# Patient Record
Sex: Female | Born: 1945 | Race: Black or African American | Hispanic: No | Smoking: Former smoker
Health system: Southern US, Community
[De-identification: ages and names within clinical notes are randomized; demographics above are authoritative.]

## PROBLEM LIST (undated history)

## (undated) DIAGNOSIS — Z950 Presence of cardiac pacemaker: Secondary | ICD-10-CM

## (undated) DIAGNOSIS — I10 Essential (primary) hypertension: Secondary | ICD-10-CM

## (undated) DIAGNOSIS — I509 Heart failure, unspecified: Secondary | ICD-10-CM

---

## 2001-09-22 ENCOUNTER — Encounter: Payer: Self-pay | Admitting: Emergency Medicine

## 2001-09-22 ENCOUNTER — Emergency Department (HOSPITAL_COMMUNITY): Admission: EM | Admit: 2001-09-22 | Discharge: 2001-09-22 | Payer: Self-pay | Admitting: Emergency Medicine

## 2001-12-21 ENCOUNTER — Encounter: Admission: RE | Admit: 2001-12-21 | Discharge: 2001-12-21 | Payer: Self-pay | Admitting: Occupational Medicine

## 2001-12-21 ENCOUNTER — Encounter: Payer: Self-pay | Admitting: Occupational Medicine

## 2002-01-30 ENCOUNTER — Encounter: Admission: RE | Admit: 2002-01-30 | Discharge: 2002-02-27 | Payer: Self-pay | Admitting: Occupational Medicine

## 2002-11-26 ENCOUNTER — Ambulatory Visit (HOSPITAL_BASED_OUTPATIENT_CLINIC_OR_DEPARTMENT_OTHER): Admission: RE | Admit: 2002-11-26 | Discharge: 2002-11-26 | Payer: Self-pay | Admitting: Orthopaedic Surgery

## 2018-05-26 DIAGNOSIS — Z95810 Presence of automatic (implantable) cardiac defibrillator: Secondary | ICD-10-CM | POA: Insufficient documentation

## 2021-04-06 ENCOUNTER — Emergency Department (HOSPITAL_COMMUNITY)
Admission: EM | Admit: 2021-04-06 | Discharge: 2021-04-07 | Disposition: A | Discharge status: Home / Self Care | Payer: Medicare HMO | Attending: Emergency Medicine | Admitting: Emergency Medicine

## 2021-04-06 ENCOUNTER — Emergency Department (HOSPITAL_COMMUNITY): Payer: Medicare HMO

## 2021-04-06 ENCOUNTER — Other Ambulatory Visit: Payer: Self-pay

## 2021-04-06 DIAGNOSIS — M25552 Pain in left hip: Secondary | ICD-10-CM | POA: Diagnosis not present

## 2021-04-06 DIAGNOSIS — M79662 Pain in left lower leg: Secondary | ICD-10-CM | POA: Diagnosis not present

## 2021-04-06 DIAGNOSIS — M7989 Other specified soft tissue disorders: Secondary | ICD-10-CM

## 2021-04-06 LAB — CBC
HCT: 40.6 % (ref 36.0–46.0)
Hemoglobin: 12.4 g/dL (ref 12.0–15.0)
MCH: 27.3 pg (ref 26.0–34.0)
MCHC: 30.5 g/dL (ref 30.0–36.0)
MCV: 89.2 fL (ref 80.0–100.0)
Platelets: 297 10*3/uL (ref 150–400)
RBC: 4.55 MIL/uL (ref 3.87–5.11)
RDW: 15.4 % (ref 11.5–15.5)
WBC: 14.1 10*3/uL — ABNORMAL HIGH (ref 4.0–10.5)
nRBC: 0 % (ref 0.0–0.2)

## 2021-04-06 LAB — BASIC METABOLIC PANEL
Anion gap: 10 (ref 5–15)
BUN: 8 mg/dL (ref 8–23)
CO2: 28 mmol/L (ref 22–32)
Calcium: 9.9 mg/dL (ref 8.9–10.3)
Chloride: 100 mmol/L (ref 98–111)
Creatinine, Ser: 0.87 mg/dL (ref 0.44–1.00)
GFR, Estimated: >60 mL/min (ref >60)
Glucose, Bld: 92 mg/dL (ref 70–99)
Potassium: 4.3 mmol/L (ref 3.5–5.1)
Sodium: 138 mmol/L (ref 135–145)

## 2021-04-06 NOTE — ED Provider Notes (Signed)
Emergency Medicine Provider Triage Evaluation Note  Taylor Jordan , a 75 y.o. female  was evaluated in triage.  Pt complains of gradual onset, constant, diffuse LLE swelling for the past 2 days. Pt also complains of pain in her thigh and calf. No hx DVT. She had a day surgery about 1 month ago for nerve stimulator. She is not on a blood thinner. She denies chest pain or SOB.  Review of Systems  Positive: + LLE swelling and pain Negative: - chest pain, SOB  Physical Exam  BP 114/67 (BP Location: Left Arm)   Pulse 79   Temp 98.7 F (37.1 C) (Oral)   Resp 15   Ht 5\' 1"  (1.549 m)   Wt 93.9 kg   SpO2 99%   BMI 39.11 kg/m  Gen:   Awake, no distress   Resp:  Normal effort  MSK:   Moves extremities without difficulty  Other:  Diffuse LLE swelling with TTP to the calf. 2+ DP pulse.   Medical Decision Making  Medically screening exam initiated at 7:21 PM.  Appropriate orders placed.  [name] was informed that the remainder of the evaluation will be completed by another provider, this initial triage assessment does not replace that evaluation, and the importance of remaining in the ED until their evaluation is complete.     Cherie Ouch, PA-C 04/06/21 04/08/21, MD 04/06/21 2252

## 2021-04-06 NOTE — ED Triage Notes (Signed)
Patient reports L leg swelling x 2 days, denies any injury, reports she took her lasix as scheduled without relief, concerned for blood clot.

## 2021-04-07 ENCOUNTER — Emergency Department (HOSPITAL_BASED_OUTPATIENT_CLINIC_OR_DEPARTMENT_OTHER)
Admission: RE | Admit: 2021-04-07 | Discharge: 2021-04-07 | Disposition: A | Discharge status: Home / Self Care | Payer: Medicare HMO | Source: Ambulatory Visit | Attending: Emergency Medicine | Admitting: Emergency Medicine

## 2021-04-07 DIAGNOSIS — M7989 Other specified soft tissue disorders: Secondary | ICD-10-CM | POA: Diagnosis not present

## 2021-04-07 MED ORDER — ENOXAPARIN SODIUM 100 MG/ML IJ SOSY
1.5000 mg/kg | PREFILLED_SYRINGE | Freq: Once | INTRAMUSCULAR | Status: AC
  Administered 2021-04-07: 100 mg via SUBCUTANEOUS
  Filled 2021-04-07: qty 1

## 2021-04-07 NOTE — Progress Notes (Signed)
Lower extremity venous has been completed.   Preliminary results in CV Proc.   Blanch Media 04/07/2021 8:19 AM

## 2021-04-07 NOTE — ED Provider Notes (Signed)
[facility] EMERGENCY DEPARTMENT Provider Note  CSN: [id number] Arrival date & time: 04/06/21 1646  Chief Complaint(s) Leg Pain  HPI Taylor Jordan is a 75 y.o. female here for 1 week of left leg swelling who developed pain in the left calf of the past couple days has been gradually getting worse since onset. Worse with palpation and ambulation. No falls or trauma. She reports having a spinal stimulator placed a month ago. No other surgeries. No prior history of DVT/PE. She denies any chest pain or shortness of breath She is endorsing left hip pain which was diagnosed as bursitis.  This intermittently radiates to the lateral thigh.   HPI  Past Medical History No past medical history on file. There are no problems to display for this patient.  Home Medication(s) Prior to Admission medications   Not on File                                                                                                                                    Past Surgical History ** The histories are not reviewed yet. Please review them in the "History" navigator section and refresh this SmartLink. Family History No family history on file.  Social History   Allergies Patient has no known allergies.  Review of Systems Review of Systems All other systems are reviewed and are negative for acute change except as noted in the HPI  Physical Exam Vital Signs  I have reviewed the triage vital signs BP 138/75 (BP Location: Left Arm)   Pulse 79   Temp 98.7 F (37.1 C) (Oral)   Resp 18   Ht 5\' 1"  (1.549 m)   Wt 93.9 kg   SpO2 100%   BMI 39.11 kg/m   Physical Exam Vitals reviewed.  Constitutional:      General: She is not in acute distress.    Appearance: She is well-developed. She is not diaphoretic.  HENT:     Head: Normocephalic and atraumatic.     Right Ear: External ear normal.     Left Ear: External ear normal.     Nose: Nose normal.  Eyes:     General: No  scleral icterus.    Conjunctiva/sclera: Conjunctivae normal.  Neck:     Trachea: Phonation normal.  Cardiovascular:     Rate and Rhythm: Normal rate and regular rhythm.  Pulmonary:     Effort: Pulmonary effort is normal. No respiratory distress.     Breath sounds: No stridor.  Abdominal:     General: There is no distension.  Musculoskeletal:        General: Normal range of motion.     Cervical back: Normal range of motion.     Right lower leg: Edema present.     Left lower leg: Tenderness (to posterior) present. Edema (greater than contralateral) present.  Neurological:     Mental Status: She is alert and oriented  to person, place, and time.  Psychiatric:        Behavior: Behavior normal.     ED Results and Treatments Labs (all labs ordered are listed, but only abnormal results are displayed) Labs Reviewed  CBC - Abnormal; Notable for the following components:      Result Value   WBC 14.1 (*)    All other components within normal limits  BASIC METABOLIC PANEL                                                                                                                         EKG  EKG Interpretation  Date/Time:    Ventricular Rate:    PR Interval:    QRS Duration:   QT Interval:    QTC Calculation:   R Axis:     Text Interpretation:        Radiology DG Chest 2 View  Result Date: 04/06/2021 CLINICAL DATA:  Left leg swelling EXAM: CHEST - 2 VIEW COMPARISON:  None. FINDINGS: Left-sided pacing device with leads over right atrium and right ventricle. No focal opacity or sizable effusion. Normal cardiomediastinal silhouette with aortic atherosclerosis. No pneumothorax. Ascending thoracic stimulator leads. Incompletely visualized spinal hardware IMPRESSION: No active cardiopulmonary disease. Electronically Signed   By: Jasmine Pang M.D.   On: 04/06/2021 20:09    Pertinent labs & imaging results that were available during my care of the patient were reviewed by me and  considered in my medical decision making (see chart for details).  Medications Ordered in ED Medications  enoxaparin (LOVENOX) injection 100 mg (has no administration in time range)                                                                                                                                    Procedures Procedures  (including critical care time)  Medical Decision Making / ED Course I have reviewed the nursing notes for this encounter and the patient's prior records (if available in EHR or on provided paperwork).   Taylor Jordan was evaluated in Emergency Department on 04/07/2021 for the symptoms described in the history of present illness. She was evaluated in the context of the global COVID-19 pandemic, which necessitated consideration that the patient might be at risk for infection with the SARS-CoV-2 virus that causes COVID-19. Institutional protocols and algorithms that pertain to the evaluation of patients at risk for  COVID-19 are in a state of rapid change based on information released by regulatory bodies including the CDC and federal and state organizations. These policies and algorithms were followed during the patient's care in the ED.  Left lower extremity pain and swelling. No falls or trauma. Will assess for DVT. At this time a night ultrasound not available. Patient's platelets and kidney function intact.  We will treat with a dose of Lovenox and have patient return in the morning for DVT ultrasound.      Final Clinical Impression(s) / ED Diagnoses Final diagnoses:  Pain and swelling of left lower leg   The patient appears reasonably screened and/or stabilized for discharge and I doubt any other medical condition or other [facility] requiring further screening, evaluation, or treatment in the ED at this time prior to discharge. Safe for discharge with strict return precautions.  Disposition: Discharge  Condition: Good  I have discussed the results, Dx  and Tx plan with the patient/family who expressed understanding and agree(s) with the plan. Discharge instructions discussed at length. The patient/family was given strict return precautions who verbalized understanding of the instructions. No further questions at time of discharge.    ED Discharge Orders         Ordered    LE VENOUS        04/07/21 0209             This chart was dictated using voice recognition software.  Despite best efforts to proofread,  errors can occur which can change the documentation meaning.   Nira Conn, MD 04/07/21 [phone]

## 2021-12-01 ENCOUNTER — Other Ambulatory Visit: Payer: Self-pay

## 2021-12-01 ENCOUNTER — Encounter (HOSPITAL_BASED_OUTPATIENT_CLINIC_OR_DEPARTMENT_OTHER): Payer: Self-pay | Admitting: Emergency Medicine

## 2021-12-01 ENCOUNTER — Emergency Department (HOSPITAL_BASED_OUTPATIENT_CLINIC_OR_DEPARTMENT_OTHER)
Admission: EM | Admit: 2021-12-01 | Discharge: 2021-12-01 | Disposition: A | Discharge status: Home / Self Care | Payer: Medicare HMO | Attending: Emergency Medicine | Admitting: Emergency Medicine

## 2021-12-01 ENCOUNTER — Emergency Department (HOSPITAL_BASED_OUTPATIENT_CLINIC_OR_DEPARTMENT_OTHER): Payer: Medicare HMO

## 2021-12-01 ENCOUNTER — Emergency Department (HOSPITAL_BASED_OUTPATIENT_CLINIC_OR_DEPARTMENT_OTHER): Payer: Medicare HMO | Admitting: Radiology

## 2021-12-01 DIAGNOSIS — I509 Heart failure, unspecified: Secondary | ICD-10-CM | POA: Diagnosis not present

## 2021-12-01 DIAGNOSIS — Z96653 Presence of artificial knee joint, bilateral: Secondary | ICD-10-CM | POA: Insufficient documentation

## 2021-12-01 DIAGNOSIS — R0789 Other chest pain: Secondary | ICD-10-CM | POA: Diagnosis not present

## 2021-12-01 DIAGNOSIS — R109 Unspecified abdominal pain: Secondary | ICD-10-CM | POA: Insufficient documentation

## 2021-12-01 DIAGNOSIS — R079 Chest pain, unspecified: Secondary | ICD-10-CM | POA: Diagnosis present

## 2021-12-01 DIAGNOSIS — W01198A Fall on same level from slipping, tripping and stumbling with subsequent striking against other object, initial encounter: Secondary | ICD-10-CM | POA: Insufficient documentation

## 2021-12-01 DIAGNOSIS — Z950 Presence of cardiac pacemaker: Secondary | ICD-10-CM | POA: Diagnosis not present

## 2021-12-01 DIAGNOSIS — I110 Hypertensive heart disease with heart failure: Secondary | ICD-10-CM | POA: Insufficient documentation

## 2021-12-01 DIAGNOSIS — W19XXXA Unspecified fall, initial encounter: Secondary | ICD-10-CM

## 2021-12-01 HISTORY — DX: Presence of cardiac pacemaker: Z95.0

## 2021-12-01 HISTORY — DX: Essential (primary) hypertension: I10

## 2021-12-01 HISTORY — DX: Heart failure, unspecified: I50.9

## 2021-12-01 LAB — COMPREHENSIVE METABOLIC PANEL
ALT: 15 U/L (ref 0–44)
AST: 20 U/L (ref 15–41)
Albumin: 4 g/dL (ref 3.5–5.0)
Alkaline Phosphatase: 40 U/L (ref 38–126)
Anion gap: 7 (ref 5–15)
BUN: 11 mg/dL (ref 8–23)
CO2: 28 mmol/L (ref 22–32)
Calcium: 9.8 mg/dL (ref 8.9–10.3)
Chloride: 104 mmol/L (ref 98–111)
Creatinine, Ser: 0.71 mg/dL (ref 0.44–1.00)
GFR, Estimated: >60 mL/min (ref >60)
Glucose, Bld: 89 mg/dL (ref 70–99)
Potassium: 3.9 mmol/L (ref 3.5–5.1)
Sodium: 139 mmol/L (ref 135–145)
Total Bilirubin: 0.5 mg/dL (ref 0.3–1.2)
Total Protein: 7 g/dL (ref 6.5–8.1)

## 2021-12-01 LAB — CBC WITH DIFFERENTIAL/PLATELET
Abs Immature Granulocytes: 0.01 10*3/uL (ref 0.00–0.07)
Basophils Absolute: 0 10*3/uL (ref 0.0–0.1)
Basophils Relative: 0 %
Eosinophils Absolute: 0.1 10*3/uL (ref 0.0–0.5)
Eosinophils Relative: 2 %
HCT: 38 % (ref 36.0–46.0)
Hemoglobin: 11.9 g/dL — ABNORMAL LOW (ref 12.0–15.0)
Immature Granulocytes: 0 %
Lymphocytes Relative: 24 %
Lymphs Abs: 1.3 10*3/uL (ref 0.7–4.0)
MCH: 28.5 pg (ref 26.0–34.0)
MCHC: 31.3 g/dL (ref 30.0–36.0)
MCV: 91.1 fL (ref 80.0–100.0)
Monocytes Absolute: 0.5 10*3/uL (ref 0.1–1.0)
Monocytes Relative: 10 %
Neutro Abs: 3.4 10*3/uL (ref 1.7–7.7)
Neutrophils Relative %: 64 %
Platelets: 238 10*3/uL (ref 150–400)
RBC: 4.17 MIL/uL (ref 3.87–5.11)
RDW: 14.4 % (ref 11.5–15.5)
WBC: 5.3 10*3/uL (ref 4.0–10.5)
nRBC: 0 % (ref 0.0–0.2)

## 2021-12-01 LAB — LACTIC ACID, PLASMA: Lactic Acid, Venous: 0.6 mmol/L (ref 0.5–1.9)

## 2021-12-01 LAB — LIPASE, BLOOD: Lipase: 50 U/L (ref 11–51)

## 2021-12-01 MED ORDER — LIDOCAINE 5 % EX PTCH: 1.0000 | MEDICATED_PATCH | CUTANEOUS | 0 refills | Status: DC

## 2021-12-01 MED ORDER — MORPHINE SULFATE (PF) 4 MG/ML IV SOLN
4.0000 mg | Freq: Once | INTRAVENOUS | Status: AC
  Administered 2021-12-01: 13:00:00 4 mg via INTRAVENOUS
  Filled 2021-12-01: qty 1

## 2021-12-01 MED ORDER — IOHEXOL 300 MG/ML  SOLN
100.0000 mL | Freq: Once | INTRAMUSCULAR | Status: AC | PRN
  Administered 2021-12-01: 14:00:00 100 mL via INTRAVENOUS

## 2021-12-01 MED ORDER — CYCLOBENZAPRINE HCL 5 MG PO TABS: 5.0000 mg | ORAL_TABLET | Freq: Two times a day (BID) | ORAL | 0 refills | Status: AC | PRN

## 2021-12-01 NOTE — ED Triage Notes (Signed)
Fell yesterday am on marble floor , hit her chest, no loc and did not hit her  head , also hit knees

## 2021-12-01 NOTE — ED Provider Notes (Signed)
[facility] EMERGENCY DEPT Provider Note   CSN: [id number] Arrival date & time: 12/01/21  1022     History  Chief Complaint  Patient presents with   Taylor Jordan Taylor Jordan is a 76 y.o. female.  The history is provided by the patient, the spouse and medical records. No language interpreter was used.  Fall This is a new problem. The current episode started yesterday. The problem occurs rarely. The problem has not changed since onset.Associated symptoms include chest pain and abdominal pain. Pertinent negatives include no headaches and no shortness of breath. The symptoms are aggravated by twisting and bending. Nothing relieves the symptoms. She has tried nothing for the symptoms. The treatment provided no relief.      Home Medications Prior to Admission medications   Not on File      Allergies    Patient has no known allergies.    Review of Systems   Review of Systems  Constitutional:  Negative for chills, diaphoresis, fatigue and fever.  HENT:  Negative for congestion.   Respiratory:  Negative for cough, chest tightness, shortness of breath and wheezing.   Cardiovascular:  Positive for chest pain. Negative for palpitations.  Gastrointestinal:  Positive for abdominal pain. Negative for constipation, diarrhea, nausea and vomiting.  Genitourinary:  Negative for dysuria and flank pain.  Musculoskeletal:  Negative for back pain, neck pain and neck stiffness.  Neurological:  Negative for weakness, light-headedness, numbness and headaches.  Psychiatric/Behavioral:  Negative for agitation and confusion.   All other systems reviewed and are negative.  Physical Exam Updated Vital Signs BP 115/76 (BP Location: Left Arm)    Pulse 84    Temp 98.8 F (37.1 C) (Oral)    Resp 18    Ht 5\' 1"  (1.549 m)    Wt 95.7 kg    SpO2 96%    BMI 39.87 kg/m  Physical Exam Vitals and nursing note reviewed.  Constitutional:      General: She is not in acute distress.     Appearance: She is well-developed. She is not ill-appearing, toxic-appearing or diaphoretic.  HENT:     Head: Normocephalic and atraumatic.     Nose: No congestion or rhinorrhea.     Mouth/Throat:     Mouth: Mucous membranes are moist.     Pharynx: No oropharyngeal exudate or posterior oropharyngeal erythema.  Eyes:     Extraocular Movements: Extraocular movements intact.     Conjunctiva/sclera: Conjunctivae normal.     Pupils: Pupils are equal, round, and reactive to light.  Cardiovascular:     Rate and Rhythm: Normal rate and regular rhythm.     Heart sounds: No murmur heard. Pulmonary:     Effort: Pulmonary effort is normal. No respiratory distress.     Breath sounds: Normal breath sounds. No wheezing, rhonchi or rales.  Chest:     Chest wall: Tenderness present.  Abdominal:     General: Abdomen is flat.     Palpations: Abdomen is soft.     Tenderness: There is abdominal tenderness. There is no guarding or rebound.  Musculoskeletal:        General: No swelling or tenderness.     Cervical back: Neck supple. No tenderness.  Skin:    General: Skin is warm and dry.     Capillary Refill: Capillary refill takes less than 2 seconds.     Findings: No erythema.  Neurological:     General: No focal deficit present.  Mental Status: She is alert.  Psychiatric:        Mood and Affect: Mood normal.    ED Results / Procedures / Treatments   Labs (all labs ordered are listed, but only abnormal results are displayed) Labs Reviewed  CBC WITH DIFFERENTIAL/PLATELET - Abnormal; Notable for the following components:      Result Value   Hemoglobin 11.9 (*)    All other components within normal limits  COMPREHENSIVE METABOLIC PANEL  LIPASE, BLOOD  LACTIC ACID, PLASMA    EKG EKG Interpretation  Date/Time:  Wednesday December 01 2021 10:37:29 EST Ventricular Rate:  82 PR Interval:  162 QRS Duration: 98 QT Interval:  398 QTC Calculation: 464 R Axis:   46 Text  Interpretation: Normal sinus rhythm Anterior infarct , age undetermined Abnormal ECG When compared to prior, similar appearance. No STEMI Confirmed by Theda Belfast ([id number]) on 12/01/2021 10:41:42 AM  Radiology CT CHEST ABDOMEN PELVIS W CONTRAST  Result Date: 12/01/2021 CLINICAL DATA:  Trauma, fall, abdominal and chest pain EXAM: CT CHEST, ABDOMEN, AND PELVIS WITH CONTRAST TECHNIQUE: Multidetector CT imaging of the chest, abdomen and pelvis was performed following the standard protocol during bolus administration of intravenous contrast. RADIATION DOSE REDUCTION: This exam was performed according to the departmental dose-optimization program which includes automated exposure control, adjustment of the mA and/or kV according to patient size and/or use of iterative reconstruction technique. CONTRAST:  OMNIPAQUE IOHEXOL 300 MG/ML  SOLN COMPARISON:  None. FINDINGS: CT CHEST FINDINGS Cardiovascular: Heart is enlarged. No pericardial effusion identified. Thoracic aorta is normal in caliber with moderate atherosclerotic plaques. Mediastinum/Nodes: No bulky axillary, hilar or mediastinal lymphadenopathy identified. Lungs/Pleura: No pleural effusion or pneumothorax identified. Mild breathing motion and subsegmental atelectatic changes bilaterally most prominent in the lung bases. Musculoskeletal: No acute fracture or acute chest wall process identified. CT ABDOMEN PELVIS FINDINGS Hepatobiliary: No hepatic injury or perihepatic hematoma. Gallbladder is unremarkable. Pancreas: Unremarkable. No pancreatic ductal dilatation or surrounding inflammatory changes. Spleen: No splenic injury or perisplenic hematoma. Adrenals/Urinary Tract: No adrenal hemorrhage or renal injury identified. Bladder is unremarkable. Stomach/Bowel: No bowel obstruction, free air or pneumatosis. Colonic diverticulosis. No bowel wall edema identified. No evidence of acute appendicitis. Vascular/Lymphatic: Aortic atherosclerosis. No enlarged  abdominal or pelvic lymph nodes. Reproductive: Status post hysterectomy. No adnexal masses. Other: No ascites. Musculoskeletal: No acute fracture identified. Postsurgical changes in the lumbar spine. Spinal stimulator device with the tip in the thoracic spine. IMPRESSION: 1. No acute traumatic process identified in the chest, abdomen or pelvis. 2. Cardiomegaly. 3. Colonic diverticulosis. Electronically Signed   By: Jannifer Hick M.D.   On: 12/01/2021 14:12   DG Knee Complete 4 Views Left  Result Date: 12/01/2021 CLINICAL DATA:  Fall on marble floor yesterday, bilateral knee pain, history of bilateral knee replacement. EXAM: LEFT KNEE - COMPLETE 4+ VIEW COMPARISON:  Contralateral knee of the same date. FINDINGS: Post LEFT total knee arthroplasty.  Enthesopathy upon the patella. Osteopenia. No acute fracture or sign of dislocation. No joint effusion. IMPRESSION: Post LEFT total knee arthroplasty. No acute fracture or dislocation. Electronically Signed   By: Donzetta Kohut M.D.   On: 12/01/2021 14:17   DG Knee Complete 4 Views Right  Result Date: 12/01/2021 CLINICAL DATA:  Fall. EXAM: RIGHT KNEE - COMPLETE 4+ VIEW COMPARISON:  Contralateral knee of the same date. FINDINGS: No sign of fracture or dislocation. Postoperative changes of RIGHT total knee arthroplasty. No sign of joint effusion. Enthesopathy upon the patella. Muscular atrophy throughout the RIGHT  lower extremity. IMPRESSION: No acute findings. Postoperative changes of RIGHT total knee arthroplasty. Electronically Signed   By: Zetta Bills M.D.   On: 12/01/2021 14:19    Procedures Procedures    Medications Ordered in ED Medications  morphine 4 MG/ML injection 4 mg (4 mg Intravenous Given 12/01/21 1249)  iohexol (OMNIPAQUE) 300 MG/ML solution 100 mL (100 mLs Intravenous Contrast Given 12/01/21 1343)    ED Course/ Medical Decision Making/ A&P                           Medical Decision Making Amount and/or Complexity of Data  Reviewed Labs: ordered. Radiology: ordered.  Risk Prescription drug management.    Taylor Jordan is a 76 y.o. female with a past medical history significant for hypertension and CHF with pacemaker who presents after a fall.  According to patient, she had a fall yesterday morning where she had a mechanical fall twisting her ankle and then fell forward onto a marble floor hitting her chest, abdomen, and knees on the ground.  She denied hitting her head and had no loss of consciousness.  She denies any pain in her head, neck, or back but is reporting severe pain that is very pleuritic in her chest and abdomen.  She also complains of pain in her knees.  She is still able to ambulate and denies any loss of bowel or bladder control.  Denies any nausea or vomiting but reports the pain has not yet been able to be controlled despite taking Tylenol at home.  She reports she cannot take any deep breaths due to the pain and is concerned she may have rib fractures.  She denies any history of previous injury like this.  On arrival, vital signs are reassuring.  She is not hypotensive, tachycardic, hypoxic, or tachypneic.  She denies any preceding symptoms before the fall.  On exam, chest is very tender across the lower chest and central chest and her abdomen is diffusely tender worse in the upper abdomen.  Hips are nontender and back is nontender.  No focal neurologic deficits.  Knees are also tender bilaterally.  Clinically I am concerned patient may have injuries and her torso.  Due to the amount of tenderness on exam, I am concerned that x-rays may not be adequate to rule out acute bony or solid organ injuries.  We will get CT scan of the chest/abdomen/pelvis to look for traumatic injuries we will get some screening blood work as well to look for a traumatic pancreatitis or other upper abdominal injury.  Patient was given some pain medicine and we will wait for work-up to be completed.  We discussed that  hopefully her work-up is reassuring this morning musculoskeletal injury but patient agrees with advanced imaging to rule out concerning injuries.  Anticipate reassessment after work-up to determine disposition.  Workup reassuring.   CT chest on pelvis did not show evidence of acute fracture or solid organ injury.  Labs reassuring.  Knee x-ray showed no acute fractures or problem with hardware.  Patient feels much relieved and agrees with likely soft tissue injury to her torso.  She would like to go home.  She will be given prescription for muscle relaxant and Lidoderm patches and will follow-up with a PCP.  She no other questions or concerns and understood return precautions.  Patient discharged in good condition.        Final Clinical Impression(s) / ED Diagnoses Final diagnoses:  Fall, initial encounter  Chest wall pain  Abdominal pain, unspecified abdominal location    Rx / DC Orders ED Discharge Orders          Ordered    cyclobenzaprine (FLEXERIL) 5 MG tablet  2 times daily PRN        12/01/21 1502    lidocaine (LIDODERM) 5 %  Every 24 hours        12/01/21 1502            Clinical Impression: 1. Fall, initial encounter   2. Chest wall pain   3. Abdominal pain, unspecified abdominal location     Disposition: Discharge  Condition: Good  I have discussed the results, Dx and Tx plan with the pt(& family if present). He/she/they expressed understanding and agree(s) with the plan. Discharge instructions discussed at great length. Strict return precautions discussed and pt &/or family have verbalized understanding of the instructions. No further questions at time of discharge.    Discharge Medication List as of 12/01/2021  3:03 PM     START taking these medications   Details  cyclobenzaprine (FLEXERIL) 5 MG tablet Take 1 tablet (5 mg total) by mouth 2 (two) times daily as needed for up to 10 days for muscle spasms., Starting Wed 12/01/2021, Until Sat 12/11/2021 at  2359, Print    lidocaine (LIDODERM) 5 % Place 1 patch onto the skin daily. Remove & Discard patch within 12 hours or as directed by MD, Starting Wed 12/01/2021, Print        Follow Up: Kathreen Devoid, PA-C [id number] [place] [address] [facility] [place]  [place] [phone]        Delos Klich, Gwenyth Allegra, MD 12/01/21 [phone]

## 2021-12-01 NOTE — Discharge Instructions (Signed)
Your history, exam, work-up today are suggestive of a soft tissue or musculoskeletal pain and injury after your fall yesterday.  The CT imaging did not show any significant acute traumatic injuries and your other work-up was reassuring as well.  Please use the muscle relaxant and Lidoderm patches to help with the discomfort and please follow-up with your primary doctor.  Please rest and stay hydrated.  If any symptoms change or worsen, please return to the nearest emergency department.

## 2022-08-24 ENCOUNTER — Other Ambulatory Visit: Payer: Self-pay

## 2022-08-24 ENCOUNTER — Emergency Department (HOSPITAL_BASED_OUTPATIENT_CLINIC_OR_DEPARTMENT_OTHER): Payer: Medicare HMO

## 2022-08-24 ENCOUNTER — Emergency Department (HOSPITAL_BASED_OUTPATIENT_CLINIC_OR_DEPARTMENT_OTHER): Payer: Medicare HMO | Admitting: Radiology

## 2022-08-24 ENCOUNTER — Other Ambulatory Visit (HOSPITAL_BASED_OUTPATIENT_CLINIC_OR_DEPARTMENT_OTHER): Payer: Self-pay

## 2022-08-24 ENCOUNTER — Emergency Department (HOSPITAL_BASED_OUTPATIENT_CLINIC_OR_DEPARTMENT_OTHER)
Admission: EM | Admit: 2022-08-24 | Discharge: 2022-08-24 | Disposition: A | Discharge status: Home / Self Care | Payer: Medicare HMO | Attending: Emergency Medicine | Admitting: Emergency Medicine

## 2022-08-24 ENCOUNTER — Encounter (HOSPITAL_BASED_OUTPATIENT_CLINIC_OR_DEPARTMENT_OTHER): Payer: Self-pay

## 2022-08-24 DIAGNOSIS — R200 Anesthesia of skin: Secondary | ICD-10-CM | POA: Insufficient documentation

## 2022-08-24 DIAGNOSIS — I509 Heart failure, unspecified: Secondary | ICD-10-CM | POA: Diagnosis not present

## 2022-08-24 DIAGNOSIS — I110 Hypertensive heart disease with heart failure: Secondary | ICD-10-CM | POA: Insufficient documentation

## 2022-08-24 DIAGNOSIS — R600 Localized edema: Secondary | ICD-10-CM

## 2022-08-24 DIAGNOSIS — R2242 Localized swelling, mass and lump, left lower limb: Secondary | ICD-10-CM | POA: Diagnosis present

## 2022-08-24 LAB — CBC
HCT: 37.5 % (ref 36.0–46.0)
Hemoglobin: 12 g/dL (ref 12.0–15.0)
MCH: 28.8 pg (ref 26.0–34.0)
MCHC: 32 g/dL (ref 30.0–36.0)
MCV: 89.9 fL (ref 80.0–100.0)
Platelets: 207 10*3/uL (ref 150–400)
RBC: 4.17 MIL/uL (ref 3.87–5.11)
RDW: 14.2 % (ref 11.5–15.5)
WBC: 5.1 10*3/uL (ref 4.0–10.5)
nRBC: 0 % (ref 0.0–0.2)

## 2022-08-24 LAB — DIFFERENTIAL
Abs Immature Granulocytes: 0.02 10*3/uL (ref 0.00–0.07)
Basophils Absolute: 0 10*3/uL (ref 0.0–0.1)
Basophils Relative: 0 %
Eosinophils Absolute: 0.1 10*3/uL (ref 0.0–0.5)
Eosinophils Relative: 1 %
Immature Granulocytes: 0 %
Lymphocytes Relative: 21 %
Lymphs Abs: 1.1 10*3/uL (ref 0.7–4.0)
Monocytes Absolute: 0.4 10*3/uL (ref 0.1–1.0)
Monocytes Relative: 8 %
Neutro Abs: 3.5 10*3/uL (ref 1.7–7.7)
Neutrophils Relative %: 70 %

## 2022-08-24 LAB — COMPREHENSIVE METABOLIC PANEL
ALT: 10 U/L (ref 0–44)
AST: 16 U/L (ref 15–41)
Albumin: 4.1 g/dL (ref 3.5–5.0)
Alkaline Phosphatase: 38 U/L (ref 38–126)
Anion gap: 11 (ref 5–15)
BUN: 5 mg/dL — ABNORMAL LOW (ref 8–23)
CO2: 28 mmol/L (ref 22–32)
Calcium: 9.6 mg/dL (ref 8.9–10.3)
Chloride: 101 mmol/L (ref 98–111)
Creatinine, Ser: 0.8 mg/dL (ref 0.44–1.00)
GFR, Estimated: >60 mL/min (ref >60)
Glucose, Bld: 106 mg/dL — ABNORMAL HIGH (ref 70–99)
Potassium: 3.4 mmol/L — ABNORMAL LOW (ref 3.5–5.1)
Sodium: 140 mmol/L (ref 135–145)
Total Bilirubin: 0.5 mg/dL (ref 0.3–1.2)
Total Protein: 7.2 g/dL (ref 6.5–8.1)

## 2022-08-24 LAB — BRAIN NATRIURETIC PEPTIDE: B Natriuretic Peptide: 229.5 pg/mL — ABNORMAL HIGH (ref 0.0–100.0)

## 2022-08-24 LAB — PROTIME-INR
INR: 1.1 (ref 0.8–1.2)
Prothrombin Time: 13.6 seconds (ref 11.4–15.2)

## 2022-08-24 LAB — APTT: aPTT: 27 seconds (ref 24–36)

## 2022-08-24 MED ORDER — CYCLOBENZAPRINE HCL 10 MG PO TABS
10.0000 mg | ORAL_TABLET | Freq: Two times a day (BID) | ORAL | 0 refills | Status: DC | PRN
  Filled 2022-08-24: qty 20, 10d supply, fill #0

## 2022-08-24 NOTE — Discharge Instructions (Signed)
Take the medication as prescribed to see if it helps with muscle spasms and discomfort.  Schedule an appointment with your primary care doctor or cardiologist for further evaluation of the leg swelling.  Schedule appointment with neurologist for further evaluation of the numbness and weakness you have been experiencing

## 2022-08-24 NOTE — ED Notes (Signed)
Patient transported to CT 

## 2022-08-24 NOTE — ED Provider Notes (Signed)
[facility] EMERGENCY DEPT Provider Note   CSN: [id number] Arrival date & time: 08/24/22  1038     History  Chief Complaint  Patient presents with   Numbness   Leg Swelling    Taylor Jordan is a 76 y.o. female.  HPI   Patient has history of hypertension and CHF and a pacemaker.  She presents to the ED with a few complaints.  Patient has been having issues with carpal tunnel syndrome.  She had an orthopedist appointment today.  During her visit she mentioned the other symptoms she had been experiencing and they suggested she come to the emergency room for evaluation.  Patient states over the last couple of months she has been having some swelling in her left leg.  Her left leg has also been feeling more heavy and weak.  In the last several weeks she has experienced some numbness on the left side of her body involving her arm and leg.  She denies any trouble with her speech or vision.  She is not having any headache.  No chest pain.  No trouble with her breathing.  Patient was sent for evaluation of possible stroke.  Home Medications Prior to Admission medications   Medication Sig Start Date End Date Taking? Authorizing Provider  cyclobenzaprine (FLEXERIL) 10 MG tablet Take 1 tablet (10 mg total) by mouth 2 (two) times daily as needed for muscle spasms. 08/24/22  Yes Linwood Dibbles, MD  acetaminophen (TYLENOL) 500 MG tablet Take 500 mg by mouth every 6 (six) hours as needed.    [provider]  carvedilol (COREG) 25 MG tablet Take 25 mg by mouth 2 (two) times daily. 09/22/21   [provider]  Coenzyme Q10 (CO Q 10 PO) Take by mouth.    [provider]  ENTRESTO 24-26 MG Take 1 tablet by mouth 2 (two) times daily. 09/17/21   [provider]  hydrOXYzine (ATARAX) 10 MG tablet Take 10 mg by mouth 3 (three) times daily. 11/22/21   [provider]  lidocaine (LIDODERM) 5 % Place 1 patch onto the skin daily. Remove & Discard patch  within 12 hours or as directed by MD 12/01/21   Tegeler, Canary Brim, MD  Omega-3 Fatty Acids (SUPER OMEGA 3 PO) Take by mouth.    [provider]  PARoxetine (PAXIL) 40 MG tablet Take 40 mg by mouth in the morning.    [provider]  spironolactone (ALDACTONE) 25 MG tablet Take 12.5 mg by mouth daily. 11/12/21   [provider]  Turmeric (QC TUMERIC COMPLEX PO) Take by mouth.    [provider]      Allergies    Patient has no known allergies.    Review of Systems   Review of Systems  Physical Exam Updated Vital Signs BP 136/82   Pulse 66   Temp 98.4 F (36.9 C)   Resp 18   Ht 1.524 m (5')   Wt 98.4 kg   SpO2 99%   BMI 42.38 kg/m  Physical Exam Vitals and nursing note reviewed.  Constitutional:      Appearance: She is well-developed. She is not diaphoretic.  HENT:     Head: Normocephalic and atraumatic.     Right Ear: External ear normal.     Left Ear: External ear normal.  Eyes:     General: No visual field deficit or scleral icterus.       Right eye: No discharge.  Left eye: No discharge.     Conjunctiva/sclera: Conjunctivae normal.  Neck:     Trachea: No tracheal deviation.  Cardiovascular:     Rate and Rhythm: Normal rate and regular rhythm.  Pulmonary:     Effort: Pulmonary effort is normal. No respiratory distress.     Breath sounds: Normal breath sounds. No stridor. No wheezing or rales.  Abdominal:     General: Bowel sounds are normal. There is no distension.     Palpations: Abdomen is soft.     Tenderness: There is no abdominal tenderness. There is no guarding or rebound.  Musculoskeletal:        General: No tenderness.     Cervical back: Neck supple.     Left lower leg: Edema present.     Comments: Left lower extremity is significantly larger than the right, no erythema noted, no tenderness  Skin:    General: Skin is warm and dry.     Findings: No rash.  Neurological:     Mental Status: She is alert and  oriented to person, place, and time.     Cranial Nerves: No cranial nerve deficit, dysarthria or facial asymmetry.     Sensory: No sensory deficit.     Motor: No abnormal muscle tone, seizure activity or pronator drift.     Coordination: Coordination normal.     Comments: Patient has difficulty lifting her left leg off the bed compared to the right, sensation intact in all extremities,  no left or right sided neglect, , no nystagmus noted   Psychiatric:        Mood and Affect: Mood normal.     ED Results / Procedures / Treatments   Labs (all labs ordered are listed, but only abnormal results are displayed) Labs Reviewed  COMPREHENSIVE METABOLIC PANEL - Abnormal; Notable for the following components:      Result Value   Potassium 3.4 (*)    Glucose, Bld 106 (*)    BUN 5 (*)    All other components within normal limits  BRAIN NATRIURETIC PEPTIDE - Abnormal; Notable for the following components:   B Natriuretic Peptide 229.5 (*)    All other components within normal limits  PROTIME-INR  APTT  CBC  DIFFERENTIAL    EKG EKG Interpretation  Date/Time:  Wednesday August 24 2022 10:48:30 EDT Ventricular Rate:  67 PR Interval:  160 QRS Duration: 94 QT Interval:  458 QTC Calculation: 483 R Axis:   0 Text Interpretation: Normal sinus rhythm Nonspecific T wave abnormality Prolonged QT Abnormal ECG When compared with ECG of 01-Dec-2021 10:37, Criteria for Anterior infarct are no longer Present Confirmed by Linwood Dibbles [phone]) on 08/24/2022 10:57:28 AM  Radiology US Venous Img Lower  Left (DVT Study)  Result Date: 08/24/2022 CLINICAL DATA:  Left lower extremity swelling for 2 months EXAM: LEFT LOWER EXTREMITY VENOUS DOPPLER ULTRASOUND TECHNIQUE: Gray-scale sonography with graded compression, as well as color Doppler and duplex ultrasound were performed to evaluate the lower extremity deep venous systems from the level of the common femoral vein and including the common femoral,  femoral, profunda femoral, popliteal and calf veins including the posterior tibial, peroneal and gastrocnemius veins when visible. The superficial great saphenous vein was also interrogated. Spectral Doppler was utilized to evaluate flow at rest and with distal augmentation maneuvers in the common femoral, femoral and popliteal veins. COMPARISON:  None Available. FINDINGS: Contralateral Common Femoral Vein: Respiratory phasicity is normal and symmetric with the symptomatic side. No evidence of  thrombus. Normal compressibility. Common Femoral Vein: No evidence of thrombus. Normal compressibility, respiratory phasicity and response to augmentation. Saphenofemoral Junction: No evidence of thrombus. Normal compressibility and flow on color Doppler imaging. Profunda Femoral Vein: No evidence of thrombus. Normal compressibility and flow on color Doppler imaging. Femoral Vein: No evidence of thrombus. Normal compressibility, respiratory phasicity and response to augmentation. Popliteal Vein: No evidence of thrombus. Normal compressibility, respiratory phasicity and response to augmentation. Calf Veins: No evidence of thrombus. Normal compressibility and flow on color Doppler imaging. Superficial Great Saphenous Vein: No evidence of thrombus. Normal compressibility. IMPRESSION: No evidence of deep venous thrombosis. Electronically Signed   By: Jerilynn Mages.  Shick M.D.   On: 08/24/2022 13:02   DG Chest 2 View  Result Date: 08/24/2022 CLINICAL DATA:  swelling extremities EXAM: CHEST - 2 VIEW COMPARISON:  04/06/2021 FINDINGS: Left-sided implanted cardiac device remains in place. Stable cardiomediastinal contours. Aortic atherosclerosis. Minimal linear bibasilar scarring or atelectasis. Lungs are otherwise clear. No pleural effusion or pneumothorax. Thoracic spinal stimulator leads are again seen. IMPRESSION: No active cardiopulmonary disease. Electronically Signed   By: Davina Poke D.O.   On: 08/24/2022 11:56   CT HEAD WO  CONTRAST  Result Date: 08/24/2022 CLINICAL DATA:  Neurological deficit EXAM: CT HEAD WITHOUT CONTRAST TECHNIQUE: Contiguous axial images were obtained from the base of the skull through the vertex without intravenous contrast. RADIATION DOSE REDUCTION: This exam was performed according to the departmental dose-optimization program which includes automated exposure control, adjustment of the mA and/or kV according to patient size and/or use of iterative reconstruction technique. COMPARISON:  None Available. FINDINGS: Brain: Encephalomalacia of the right parietal lobe, compatible with prior infarct. Chronic white matter ischemic change. No evidence of acute infarction, hemorrhage, hydrocephalus, extra-axial collection or mass lesion/mass effect. Vascular: No hyperdense vessel or unexpected calcification. Skull: Normal. Negative for fracture or focal lesion. Sinuses/Orbits: No acute finding. Other: None. IMPRESSION: 1. No acute intracranial abnormality. 2. Right parietal lobe encephalomalacia, compatible with prior infarct. Electronically Signed   By: Yetta Glassman M.D.   On: 08/24/2022 11:49    Procedures Procedures    Medications Ordered in ED Medications - No data to display  ED Course/ Medical Decision Making/ A&P Clinical Course as of 08/24/22 1402  Wed Aug 24, 2022  1329 CBC CBC normal [JK]  1329 Comprehensive metabolic panel(!) Metabolic panel with slightly decreased potassium level [JK]  1331 US Venous Img Lower  Left (DVT Study) Doppler study negative for DVT [JK]  1331 DG Chest 2 View X-ray without acute findings [JK]  1331 CT HEAD WO CONTRAST CT scan shows evidence of prior infarct [JK]    Clinical Course User Index [JK] Dorie Rank, MD                           Medical Decision Making Differential diagnosis includes DVT, peripheral edema, doubt cellulitis without pain or signs of infection  Numbness and paresthesia on the left side concerning for the possibility of stroke,  TIA, brain tumor, hemorrhage, cervical stenosis radiculopathy  Problems Addressed: Edema of left lower leg: chronic illness or injury Numbness: chronic illness or injury with exacerbation, progression, or side effects of treatment  Amount and/or Complexity of Data Reviewed Labs: ordered. Decision-making details documented in ED Course. Radiology: ordered. Decision-making details documented in ED Course.  Risk Prescription drug management.   Patient presented for evaluation of 2 separate complaints.  Patient has been having chronic leg swelling.  Was concerned  about the possibility of DVT.  She does not have any tenderness or redness to suggest infection.  Doppler study is negative for DVT.  Patient's BNP is slightly elevated.  She does not have any signs of pulmonary edema.  I doubt acute CHF exacerbation.  Cardiologist to discuss further treatment.  Patient also complaining of some numbness on the left side.  Symptoms have been ongoing for at least several months now.  Patient CT scan does not show any acute abnormality.  It does show encephalomalacia.  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  She denies having any significant deficits from that prior stroke.  It is certainly possible she could have had a more recent stroke causing some of these chronic problems.  Cervical radiculopathy is also a concern as the patient mentions having some neck discomfort making it hard for her to sleep recently.  I do think the patient would benefit from further evaluation including MRI and neurological consultation.  MRI is not available at this facility.  I do not think she requires emergent imaging or transfer considering the chronicity of her symptoms.  I will place a referral to neurology.  Warning signs and precautions discussed.        Final Clinical Impression(s) / ED Diagnoses Final diagnoses:  Edema of left lower leg  Numbness    Rx / DC Orders ED Discharge Orders           Ordered    Ambulatory referral to Neurology       Comments: An appointment is requested in approximately: 1 week   08/24/22 1357    cyclobenzaprine (FLEXERIL) 10 MG tablet  2 times daily PRN        08/24/22 1357              Linwood Dibbles, MD 08/24/22 1403

## 2022-08-24 NOTE — ED Triage Notes (Signed)
Pt reports she has been having numbness to her left arm and left leg for the past 2 months. She states she has also been having some increased swelling in her left leg despite taking her diuretics. Pt is AxO oriented x 4. No focal neuro deficits noted.

## 2022-08-25 ENCOUNTER — Encounter: Payer: Self-pay | Admitting: Neurology

## 2022-10-11 ENCOUNTER — Emergency Department (HOSPITAL_COMMUNITY): Payer: Medicare HMO

## 2022-10-11 ENCOUNTER — Other Ambulatory Visit: Payer: Self-pay

## 2022-10-11 ENCOUNTER — Emergency Department (HOSPITAL_COMMUNITY)
Admission: EM | Admit: 2022-10-11 | Discharge: 2022-10-12 | Discharge status: Against Medical Advice | Payer: Medicare HMO | Attending: Emergency Medicine | Admitting: Emergency Medicine

## 2022-10-11 DIAGNOSIS — M7989 Other specified soft tissue disorders: Secondary | ICD-10-CM | POA: Diagnosis not present

## 2022-10-11 DIAGNOSIS — R42 Dizziness and giddiness: Secondary | ICD-10-CM | POA: Insufficient documentation

## 2022-10-11 DIAGNOSIS — Z5321 Procedure and treatment not carried out due to patient leaving prior to being seen by health care provider: Secondary | ICD-10-CM | POA: Insufficient documentation

## 2022-10-11 DIAGNOSIS — R200 Anesthesia of skin: Secondary | ICD-10-CM | POA: Insufficient documentation

## 2022-10-11 DIAGNOSIS — R531 Weakness: Secondary | ICD-10-CM | POA: Diagnosis not present

## 2022-10-11 LAB — CBC
HCT: 42.2 % (ref 36.0–46.0)
Hemoglobin: 13.3 g/dL (ref 12.0–15.0)
MCH: 29.3 pg (ref 26.0–34.0)
MCHC: 31.5 g/dL (ref 30.0–36.0)
MCV: 93 fL (ref 80.0–100.0)
Platelets: 271 10*3/uL (ref 150–400)
RBC: 4.54 MIL/uL (ref 3.87–5.11)
RDW: 14.8 % (ref 11.5–15.5)
WBC: 7.4 10*3/uL (ref 4.0–10.5)
nRBC: 0 % (ref 0.0–0.2)

## 2022-10-11 LAB — URINALYSIS, ROUTINE W REFLEX MICROSCOPIC
Bilirubin Urine: NEGATIVE
Glucose, UA: NEGATIVE mg/dL
Hgb urine dipstick: NEGATIVE
Ketones, ur: 5 mg/dL — AB
Nitrite: NEGATIVE
Protein, ur: NEGATIVE mg/dL
Specific Gravity, Urine: 1.028 (ref 1.005–1.030)
WBC, UA: >50 WBC/hpf — ABNORMAL HIGH (ref 0–5)
pH: 5 (ref 5.0–8.0)

## 2022-10-11 LAB — BASIC METABOLIC PANEL
Anion gap: 9 (ref 5–15)
BUN: 14 mg/dL (ref 8–23)
CO2: 25 mmol/L (ref 22–32)
Calcium: 9.7 mg/dL (ref 8.9–10.3)
Chloride: 103 mmol/L (ref 98–111)
Creatinine, Ser: 0.82 mg/dL (ref 0.44–1.00)
GFR, Estimated: >60 mL/min (ref >60)
Glucose, Bld: 89 mg/dL (ref 70–99)
Potassium: 3.9 mmol/L (ref 3.5–5.1)
Sodium: 137 mmol/L (ref 135–145)

## 2022-10-11 LAB — CBG MONITORING, ED: Glucose-Capillary: 82 mg/dL (ref 70–99)

## 2022-10-11 NOTE — ED Triage Notes (Signed)
Pt reports that while getting her hair done this evening she became lightheaded. She also noticed that her right had is swollen and she feels like it looks discolored. Right groin pain on and off for several months.

## 2022-10-11 NOTE — ED Notes (Signed)
Pt given urine cup and made aware of specimen

## 2022-10-11 NOTE — ED Provider Triage Note (Signed)
Emergency Medicine Provider Triage Evaluation Note  Taylor Jordan , a 76 y.o. female  was evaluated in triage.  Pt complains of R hand swelling and numbness.  No known injury.  Also having months of progressively worsening left-sided upper and lower weakness now progressing to the right side.  Patient is still able to ambulate but unable to raise her arm.  She is seen by outpatient neuro and had a scheduled MRI of the C-spine however this was canceled due to having a spinal stimulator in place.  Review of Systems  Positive: Weakness and numbness swelling of the right hand Negative: Shortness of breath  Physical Exam  BP (!) 143/86   Pulse 85   Temp 98.6 F (37 C) (Oral)   Resp 17   SpO2 98%  Gen:   Awake, no distress   Resp:  Normal effort  MSK:   Moves extremities without difficulty  Other:    Medical Decision Making  Medically screening exam initiated at 10:17 PM.  Appropriate orders placed.  Taylor Jordan was informed that the remainder of the evaluation will be completed by another provider, this initial triage assessment does not replace that evaluation, and the importance of remaining in the ED until their evaluation is complete.     Arthor Captain, PA-C 10/11/22 2221

## 2023-01-18 ENCOUNTER — Ambulatory Visit: Payer: Medicare HMO | Admitting: Neurology

## 2023-03-20 ENCOUNTER — Encounter: Payer: Self-pay | Admitting: Cardiology

## 2023-03-20 ENCOUNTER — Ambulatory Visit: Payer: Medicare HMO | Admitting: Cardiology

## 2023-03-20 VITALS — BP 80/70 | HR 71 | Resp 16 | Ht 60.0 in | Wt 204.0 lb

## 2023-03-20 DIAGNOSIS — Z4502 Encounter for adjustment and management of automatic implantable cardiac defibrillator: Secondary | ICD-10-CM

## 2023-03-20 DIAGNOSIS — I428 Other cardiomyopathies: Secondary | ICD-10-CM | POA: Insufficient documentation

## 2023-03-20 DIAGNOSIS — I951 Orthostatic hypotension: Secondary | ICD-10-CM

## 2023-03-20 DIAGNOSIS — E854 Organ-limited amyloidosis: Secondary | ICD-10-CM

## 2023-03-20 DIAGNOSIS — I5032 Chronic diastolic (congestive) heart failure: Secondary | ICD-10-CM

## 2023-03-20 DIAGNOSIS — E851 Neuropathic heredofamilial amyloidosis: Secondary | ICD-10-CM

## 2023-03-20 DIAGNOSIS — Z95810 Presence of automatic (implantable) cardiac defibrillator: Secondary | ICD-10-CM

## 2023-03-20 NOTE — Progress Notes (Signed)
Primary Physician/Referring:  Clemencia Course, PA-C  Patient ID: ZAHLIA VALENTINI, female    DOB: 01-25-1946, 77 y.o.   MRN: [id number]  Chief Complaint  Patient presents with   New Patient (Initial Visit)   history of amyloidosis   HPI:    Braylinn ZARAYA VIRES  is a 77 y.o.  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  Previously reduced as low as 25-30% (01/05/22), subsequently improved to 40-45% (06/13/22) with GDMT. A Lexiscan from 2022 was negative for ischemia. 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  She is pretty much wheelchair-bound and hardly walks with the help of walker at home due to neurologic weakness.  Denies dyspnea, PND or orthopnea, leg edema.  Past Medical History:  Diagnosis Date   CHF (congestive heart failure) (HCC)    Hypertension    Pacemaker    History reviewed. No pertinent surgical history. Family History  Problem Relation Age of Onset   Heart disease Mother     Social History   Tobacco Use   Smoking status: Former    Packs/day: 1.00    Years: 30.00    Additional pack years: 0.00    Total pack years: 30.00    Types: Cigarettes    Quit date: 1986    Years since quitting: 38.3    Passive exposure: Never   Smokeless tobacco: Never  Substance Use Topics   Alcohol use: Never   Marital Status: Divorced  ROS  Review of Systems  Cardiovascular:  Negative for chest pain, dyspnea on exertion and leg swelling.  Musculoskeletal:   Positive for muscle weakness.   Objective      03/20/2023    1:37 PM 10/12/2022    1:59 AM 10/11/2022    9:46 PM  Vitals with BMI  Height 5\' 0"     Weight 204 lbs    BMI 39.84    Systolic 80 126   Diastolic 70 82   Pulse 71 78 85   SpO2: 97 %  Orthostatic VS for the past 72 hrs (Last 3 readings):  Patient Position BP Location Cuff Size  03/20/23 1337 Sitting Left Arm Large    Physical Exam Constitutional:      Comments: Morbidly obese in no acute distress.  Neck:     Vascular: No carotid bruit or JVD.  Cardiovascular:     Rate and Rhythm: Normal rate and regular rhythm.     Pulses: Normal pulses and intact distal pulses.     Heart sounds: Normal heart sounds. No murmur heard.    No gallop.  Pulmonary:     Effort: Pulmonary effort is normal.     Breath sounds: Normal breath sounds.  Abdominal:     General: Bowel sounds are normal.     Palpations: Abdomen is soft.     Comments: Obese. Pannus present  Musculoskeletal:     Right lower leg: No edema.     Left lower leg: No edema.     Laboratory examination:   Recent Labs    08/24/22  1129 10/11/22 2209  NA 140 137  K 3.4* 3.9  CL 101 103  CO2 28 25  GLUCOSE 106* 89  BUN 5* 14  CREATININE 0.80 0.82  CALCIUM 9.6 9.7  GFRNONAA >60 >60    Lab Results  Component Value Date   GLUCOSE 89 10/11/2022   NA 137 10/11/2022   K 3.9 10/11/2022   CL 103 10/11/2022   CO2 25 10/11/2022   BUN 14 10/11/2022   CREATININE 0.82 10/11/2022   GFRNONAA >60 10/11/2022   CALCIUM 9.7 10/11/2022   PROT 7.2 08/24/2022   ALBUMIN 4.1 08/24/2022   BILITOT 0.5 08/24/2022   ALKPHOS 38 08/24/2022   AST 16 08/24/2022   ALT 10 08/24/2022   ANIONGAP 9 10/11/2022      Lab Results  Component Value Date   ALT 10 08/24/2022   AST 16 08/24/2022   ALKPHOS 38 08/24/2022   BILITOT 0.5 08/24/2022       Latest Ref Rng & Units 08/24/2022   11:29 AM 12/01/2021   12:40 PM  Hepatic Function  Total Protein 6.5 - 8.1 g/dL 7.2  7.0    Albumin 3.5 - 5.0 g/dL 4.1  4.0   AST 15 - 41 U/L 16  20   ALT 0 - 44 U/L 10  15   Alk Phosphatase 38 - 126 U/L 38  40   Total Bilirubin 0.3 - 1.2 mg/dL 0.5  0.5     External labs:   Free Kappa And Lambda Light Chains with Ratio, Quantitative 11/29/2022 Component Ref Range & Units 3 mo ago  HX FREE KAPPA 3.30 - 19.40 mg/L 33.51 High   HX FREE LAMBDA 5.71 - 26.30 mg/L 19.55  HX KAPPA/LAMBDA RATIO 0.26 - 1.65 1.71 High   Resulting Agency [facility]  Narrative Performed by [facility]  HX B-TYPE NATRIURETIC PEPTIDE 5 - 100 PG/ML 12/27/2022 110 High    Lipid profile 02/27/2023:  Total cholesterol 146, triglycerides 77, HDL 41, LDL 88.  HX TSH  0.45 - 5.33 UIU/ML 26 2023 1.30   Radiology:  Chest x-ray 12/12/2022: 1.  Mild bibasilar atelectasis/scarring without evidence of acute cardiac or pulmonary abnormality.  2.  Left subclavian cardiac rhythm maintenance device in situ. No evidence of lead fracture or discontinuity to preclude MRI imaging.   Cardiac Studies:   Lower Extremity Venous Duplex  02/08/2023: Right  No evidence of deep vein thrombosis or venous obstruction in the lower extremity. Valvular incompetence of the common femoral vein, saphenofemoral junction, and mid small saphenous vein only. Shin edema noted. Spot imaging of the posterior tibial artery revealed multiphasic flow.  Left  No evidence of deep vein thrombosis or venous obstruction in the lower extremity. Valvular incompetence of the common femoral vein only. Calf and ankle edema noted. Spot imaging of the posterior tibial artery revealed multiphasic flow. No results found for this or any previous visit from the past 1095 days.  Nuclear medicine cardial pyrophosphate study 10/17/2022: Visual and quantitative assessment (grade 2, H/CLL equal 1.9) are  strongly suggestive of transthyretin amyloidosis.     ICD remote dual-chamber Medtronic EVERA MRI   Remote transmission  12/10/2022: NORMAL DEVICE FUNCTION. AP  5.8%, VP   0.0%. 13 DEVICE-DEFINED VT-NS EPISODES  FASTEST 207 BPM, LONGEST 14 SEC ,  MANY ATRIALLY DRIVEN; LONGEST NSVT 13 BEATS. OPTIVOL WAS OUT OF RANGE 10-1 - 09-02-22. LEAD TRENDS,  HISTOGRAMS, AND OTHER DIAGNOSTIC TRENDS STABLE. ESTIMATED 6.4 YEARS  29-Nov-2022  REMAINING UNTIL ERI. NEXT  REMOTE IN 91 DAYS.   EKG:   EKG 03/20/2023: Normal sinus rhythm at the rate of 78 bpm, left atrial enlargement, normal axis.  Low-voltage complexes.   Medications and allergies  No Known Allergies   Medication list   Current Outpatient Medications:    ALPHA LIPOIC AC-BIOTIN-BERBERI PO, Take 1 tablet by mouth daily., Disp: , Rfl:    ASPIRIN LOW DOSE 81 MG tablet, Take 81 mg by mouth daily., Disp: , Rfl:    B Complex-C (SUPER B COMPLEX PO), Take 1 tablet by mouth daily at 6 (six) AM., Disp: , Rfl:    Baclofen 5 MG TABS, Take 3 tablets by mouth 3 (three) times daily., Disp: , Rfl:    carvedilol (COREG) 6.25 MG tablet, Take 6.25 mg by mouth 2 (two) times daily. Take 3 tabs BID, Disp: , Rfl:    cholecalciferol (VITAMIN D3) 25 MCG (1000 UNIT) tablet, Take 1,000 Units by mouth daily., Disp: , Rfl:    CHOLINE PO, Take 1 tablet by mouth daily at 6 (six) AM., Disp: , Rfl:    Coenzyme Q10 (CO Q 10 PO), Take by mouth., Disp: , Rfl:    cyanocobalamin (VITAMIN B12) 1000 MCG tablet, Take 1,000 mcg by mouth daily., Disp: , Rfl:    fexofenadine (ALLEGRA) 180 MG tablet, Take 180 mg by mouth daily., Disp: , Rfl:    furosemide (LASIX) 20 MG tablet, Take 20 mg by mouth every other day., Disp: , Rfl:    gabapentin (NEURONTIN) 300 MG capsule, Take 300 mg by mouth 2 (two) times daily., Disp: , Rfl:    ibuprofen (ADVIL) 200 MG tablet, Take 200 mg by mouth every 6 (six) hours as needed. 4 tabs in the morning and 2 tab in the evening, Disp: , Rfl:    magnesium oxide (MAG-OX) 400 MG tablet, Take 400 mg by mouth 2 (two) times daily., Disp: , Rfl:    Multiple Vitamin (MULTI-VITAMIN) tablet,  Take 1 tablet by mouth daily., Disp: , Rfl:    Omega-3 Fatty Acids (SUPER OMEGA 3 PO), Take by mouth., Disp: , Rfl:    PARoxetine (PAXIL) 40 MG tablet, Take 40 mg by mouth in the morning., Disp: , Rfl:    sacubitril-valsartan (ENTRESTO) 49-51 MG, Take 0.5 tablets by mouth 2 (two) times daily., Disp: , Rfl:    spironolactone (ALDACTONE) 25 MG tablet, Take 25 mg by mouth daily., Disp: , Rfl:    triamcinolone cream (KENALOG) 0.5 %, Apply 1 Application topically once., Disp: , Rfl:    Turmeric (QC TUMERIC COMPLEX PO), Take by mouth., Disp: , Rfl:    VYNDAMAX 61 MG CAPS, Take 1 capsule by mouth daily., Disp: , Rfl:   Assessment     ICD-10-CM   1. Familial amyloid heart disease (HCC)  E85.4 EKG 12-Lead   I43 Ambulatory referral to Neurology    ECHOCARDIOGRAM COMPLETE    2. Orthostatic hypotension  I95.1     3. Amyloid neuropathy (HCC)  E85.1 Ambulatory referral to Neurology   G63     4. Non-ischemic cardiomyopathy (HCC)  I42.8 ECHOCARDIOGRAM COMPLETE    5. Chronic heart failure with preserved ejection fraction (HCC)  I50.32 ECHOCARDIOGRAM COMPLETE       Orders Placed This Encounter  Procedures   Ambulatory referral to Neurology    Referral Priority:   Routine    Referral Type:   Consultation    Referral Reason:   Specialty Services Required    Referred to Provider:   Glendale Chard,  DO    Requested Specialty:   Neurology    Number of Visits Requested:   1   EKG 12-Lead   ECHOCARDIOGRAM COMPLETE    Standing Status:   Future    Standing Expiration Date:   03/19/2024    Order Specific Question:   Where should this test be performed    Answer:   [place]    Order Specific Question:   Perflutren DEFINITY (image enhancing agent) should be administered unless hypersensitivity or allergy exist    Answer:   Administer Perflutren    Order Specific Question:   Reason for exam-Echo    Answer:   Nonischemic Cardiomyopathy I42.8    Order Specific Question:   Other Comments    Answer:    Amyloid heart disease, need strain    No orders of the defined types were placed in this encounter.  Medication changes today: Reduce Aldactone from 25 mg to 1/2 tablet daily Reduce Entresto from 49/51 mg to 1/2 tablet twice daily  Medications Discontinued During This Encounter  Medication Reason   cyclobenzaprine (FLEXERIL) 10 MG tablet    hydrOXYzine (ATARAX) 10 MG tablet    lidocaine (LIDODERM) 5 %    acetaminophen (TYLENOL) 500 MG tablet      Recommendations:   Ericka MICHALINA HUSCHER is a 77 y.o.  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  Previously reduced as low as 25-30% (01/05/22), subsequently improved to 40-45% (06/13/22) with GDMT. A Lexiscan from 2022 was negative for ischemia. 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  1. Familial amyloid heart disease (HCC) Patient is presently on Vyndamax and guideline directed therapy for chronic systolic heart failure and familial amyloid heart disease.  She appears to be well compensated.  Unfortunately she has not been able to be ambulatory due to severe neurologic issues and generalized weakness of both upper and lower extremities and is presently on a wheelchair and walks very little with the help of a walker at home.  - EKG 12-Lead - Ambulatory referral to Neurology - ECHOCARDIOGRAM COMPLETE; Future  2. Orthostatic hypotension Patient has history of orthostatic hypotension, however orthostasis could not be performed today as patient unable to stand up.  3. Amyloid neuropathy (HCC) As patient is significantly disabled with marked weakness in her upper extremities and lower extremities related to spinal  cord issues, she would like to try [facility] for amyloid neuropathy and cardiomyopathy.  She has been followed by [facility] system however she has to wait till October 2024 to see a neurologist for consideration of change in therapy.  She wants to establish with local neurology.  I have made a referral for patient to be seen by Dr. Nita Sickle.  - Ambulatory referral to Neurology  4. Non-ischemic cardiomyopathy (HCC) I do not have a cardiac evaluation.  I looked through extensively to her whole charting on Care Everywhere, had to construct her chart via multiple physician visits.  It appears she has had a negative nuclear stress test in 2022 and is now being treated for nonischemic cardiomyopathy for hereditary ATTR cardiomyopathy and neuropathy.  Will obtain an echocardiogram to reevaluate her LV systolic function.  She does have a dual-chamber ICD and will transfer  her records to [place]. - ECHOCARDIOGRAM COMPLETE; Future  5. Chronic heart failure with preserved ejection fraction (HCC) Patient is on guideline directed medical therapy however her blood pressure is very soft at 80 to 85 mmHg, will reduced aldactone to 12.5 mg daily and she will continue Lasix every other day. Reduce Entresto 49-51 mg to 1/2 BID.  Continue Coreg low dose. Consider Corlinor if low EF.  I will see her back in the office in 2 months for follow-up.  - ECHOCARDIOGRAM COMPLETE; Future    Yates Decamp, MD, [facility] 03/20/2023, 7:34 PM Office: [phone]

## 2023-03-24 ENCOUNTER — Encounter: Payer: Self-pay | Admitting: Neurology

## 2023-04-13 ENCOUNTER — Encounter: Payer: Self-pay | Admitting: Cardiology

## 2023-04-17 ENCOUNTER — Ambulatory Visit (HOSPITAL_COMMUNITY)
Admission: RE | Admit: 2023-04-17 | Discharge: 2023-04-17 | Disposition: A | Discharge status: Home / Self Care | Payer: Medicare HMO | Source: Ambulatory Visit | Attending: Cardiology | Admitting: Cardiology

## 2023-04-17 DIAGNOSIS — I43 Cardiomyopathy in diseases classified elsewhere: Secondary | ICD-10-CM | POA: Insufficient documentation

## 2023-04-17 DIAGNOSIS — I5032 Chronic diastolic (congestive) heart failure: Secondary | ICD-10-CM | POA: Diagnosis not present

## 2023-04-17 DIAGNOSIS — I110 Hypertensive heart disease with heart failure: Secondary | ICD-10-CM | POA: Insufficient documentation

## 2023-04-17 DIAGNOSIS — I3481 Nonrheumatic mitral (valve) annulus calcification: Secondary | ICD-10-CM | POA: Diagnosis not present

## 2023-04-17 DIAGNOSIS — E854 Organ-limited amyloidosis: Secondary | ICD-10-CM | POA: Diagnosis not present

## 2023-04-17 DIAGNOSIS — I5022 Chronic systolic (congestive) heart failure: Secondary | ICD-10-CM | POA: Insufficient documentation

## 2023-04-17 DIAGNOSIS — I428 Other cardiomyopathies: Secondary | ICD-10-CM | POA: Diagnosis present

## 2023-04-17 DIAGNOSIS — Z950 Presence of cardiac pacemaker: Secondary | ICD-10-CM | POA: Insufficient documentation

## 2023-04-17 LAB — ECHOCARDIOGRAM COMPLETE
Area-P 1/2: 3.6 cm2
Calc EF: 41.6 %
S' Lateral: 3.2 cm
Single Plane A2C EF: 39.7 %
Single Plane A4C EF: 44 %

## 2023-04-17 NOTE — Progress Notes (Signed)
Echocardiogram 2D Echocardiogram has been performed.  Taylor Jordan 04/17/2023, 2:00 PM

## 2023-04-17 NOTE — Progress Notes (Signed)
Echocardiogram 04/17/2023: 1. Relative apical sparing noted in the strain suggests amyloid heart disease. Left ventricular ejection fraction, by estimation, is 35 to 40%. Left ventricular ejection fraction by 3D volume is 39 %. The left ventricle has moderately decreased  function. The left ventricle demonstrates global hypokinesis. Left ventricular diastolic parameters were normal. The average left ventricular global longitudinal strain is -6.6 %. The global longitudinal strain is abnormal.  2. Pacemaker/ICD lead noted. Right ventricular systolic function is normal. The right ventricular size is normal. There is normal pulmonary artery systolic pressure. The estimated right ventricular systolic pressure is 25.8 mmHg.  3. Left atrial size was moderately dilated.  4. The mitral valve is normal in structure. No evidence of mitral valve regurgitation. No evidence of mitral stenosis. Moderate mitral annular calcification.  5. The aortic valve is tricuspid. There is mild calcification of the aortic valve. There is moderate thickening of the aortic valve. Aortic valve regurgitation is trivial. Aortic valve sclerosis/calcification is present, without any evidence of aortic  stenosis.

## 2023-04-18 ENCOUNTER — Encounter: Payer: Self-pay | Admitting: Cardiology

## 2023-04-18 NOTE — Telephone Encounter (Signed)
From patient.

## 2023-04-25 ENCOUNTER — Ambulatory Visit: Payer: Medicare HMO | Admitting: Neurology

## 2023-04-25 ENCOUNTER — Encounter: Payer: Self-pay | Admitting: Neurology

## 2023-04-25 VITALS — BP 85/60 | HR 77 | Ht 60.0 in | Wt 199.0 lb

## 2023-04-25 DIAGNOSIS — G63 Polyneuropathy in diseases classified elsewhere: Secondary | ICD-10-CM

## 2023-04-25 DIAGNOSIS — G959 Disease of spinal cord, unspecified: Secondary | ICD-10-CM

## 2023-04-25 DIAGNOSIS — E851 Neuropathic heredofamilial amyloidosis: Secondary | ICD-10-CM | POA: Diagnosis not present

## 2023-04-25 NOTE — Progress Notes (Signed)
[facility] Clinic Note - Initial Visit   Date: 04/25/2023   Taylor Jordan MRN: [id number] DOB: 04-09-1946   Dear Dr. Jacinto Halim:  Thank you for your kind referral of Taylor Jordan for consultation of TTR amyloidosis. Although her history is well known to you, please allow [place] to reiterate it for the purpose of our medical record. The patient was accompanied to the clinic by husband who also provides collateral information.     Taylor Jordan is a 77 y.o. right-handed female with TTR hereditary amyloidosis with cardiac involvement, cervical canal stenosis with myelopathy, and lumbar canal stenosis s/p fusion (2019) with adjacent disease presenting for evaluation of neuropathy.   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  NCS/EMG performed at [facility] confirmed the presence of neuropathy.  She also has overlapping severe cervical canal stenosis with myelomalacia at C1-C2 which is causing her hand weakness, left sided numbness, and gait difficulty.  She is scheduled to have decompression next month. From a neurological standpoint, in addition to neuropathy she also has severe bilateral carpal tunnel syndrome which is common in this condition.  FAP stage III.  PND IV.  She has researched both Amv[place]tra and [place] and would like to start the latter. I will start her on [place] which is a monthly injection.  Her husband will administer this.  She was encouraged to start a daily multivitamin to supplement for vitamin A  Return to clinic in months  ------------------------------------------------------------- History of present illness: Starting in 2023, she began having burning and numbness of the hands.  Symptoms have been slowly progressive.   NCS/EMG performed at [facility] shows neuropathy in the arm and leg. She was found to have hereditary TTR amyloidosis and had genetic testing showing Val142Ile  mutation, which her son also carries. She also complains of numbness of the left arm and leg and has been using a walker since summer of 2023.  Her hand have been progressively getting weaker and it is difficult for her to do an fine motor tasks.  Her husband helps with bathing and dressing.  MRI cervical spine from February 2023 shows severe cervical canal stenosis at C1-C2 with cord changes.  She is scheduled for surgery next month.       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   Out-side paper records, electronic medical record, and images have been reviewed where available and summarized as:  MRI cervical spine 12/12/2022: 1.  Signal change noted within the spinal cord between the posterior arch of C1 and pannus formation at C1-C2 concerning for substantial craniocervical junction stenosis resulting in myelomalacia or less likely spinal cord edema. Focal kinking of the cord at this level.  2.  T2 hyperintensity within the left dorsal spinal cord at the level C4 favored focal myelomalacia.  3.  Varying levels of degenerative disc disease and neuroforaminal/canal stenosis described as above.    Past Medical History:  Diagnosis Date   CHF (congestive heart failure) (HCC)    Encounter for assessment of implantable cardioverter-defibrillator (ICD) 03/20/2023   Familial transthyretin amyloid cardiomyopathy (HCC) 03/20/2023   Hypertension    ICD Dual chamber Medtronic ICD-EVERA XT/DR-placed 05/26/2018 05/26/2018   Pacemaker     No past surgical history on file.   Medications:  Outpatient Encounter Medications as of 04/25/2023  Medication Sig   ALPHA LIPOIC AC-BIOTIN-BERBERI PO Take 1 tablet  by mouth daily.   ASPIRIN LOW DOSE 81 MG tablet Take 81 mg by mouth daily.   B Complex-C (SUPER B COMPLEX PO) Take 1 tablet by mouth daily at 6 (six) AM.   Baclofen 5 MG TABS Take 3 tablets by mouth 3 (three) times daily.    carvedilol (COREG) 6.25 MG tablet Take 6.25 mg by mouth 2 (two) times daily. Take 3 tabs BID   cholecalciferol (VITAMIN D3) 25 MCG (1000 UNIT) tablet Take 1,000 Units by mouth daily.   CHOLINE PO Take 1 tablet by mouth daily at 6 (six) AM.   Coenzyme Q10 (CO Q 10 PO) Take by mouth.   cyanocobalamin (VITAMIN B12) 1000 MCG tablet Take 1,000 mcg by mouth daily.   fexofenadine (ALLEGRA) 180 MG tablet Take 180 mg by mouth daily.   furosemide (LASIX) 20 MG tablet Take 20 mg by mouth every other day.   gabapentin (NEURONTIN) 300 MG capsule Take 300 mg by mouth 2 (two) times daily.   ibuprofen (ADVIL) 200 MG tablet Take 200 mg by mouth every 6 (six) hours as needed. 4 tabs in the morning and 2 tab in the evening   magnesium oxide (MAG-OX) 400 MG tablet Take 400 mg by mouth 2 (two) times daily.   Multiple Vitamin (MULTI-VITAMIN) tablet Take 1 tablet by mouth daily.   Omega-3 Fatty Acids (SUPER OMEGA 3 PO) Take by mouth.   PARoxetine (PAXIL) 40 MG tablet Take 40 mg by mouth in the morning.   sacubitril-valsartan (ENTRESTO) 49-51 MG Take 0.5 tablets by mouth 2 (two) times daily.   spironolactone (ALDACTONE) 25 MG tablet Take 25 mg by mouth daily.   triamcinolone cream (KENALOG) 0.5 % Apply 1 Application topically once.   Turmeric (QC TUMERIC COMPLEX PO) Take by mouth.   VYNDAMAX 61 MG CAPS Take 1 capsule by mouth daily.   No facility-administered encounter medications on file as of 04/25/2023.    Allergies: No Known Allergies  Family History: Family History  Problem Relation Age of Onset   Heart disease Mother     Social History: Social History   Tobacco Use   Smoking status: Former    Packs/day: 1.00    Years: 30.00    Additional pack years: 0.00    Total pack years: 30.00    Types: Cigarettes    Quit date: 1986    Years since quitting: 38.4    Passive exposure: Never   Smokeless tobacco: Never  Vaping Use   Vaping Use: Never used  Substance Use Topics   Alcohol use: Never    Drug use: Yes    Types: Marijuana   Social History   Social History Narrative   Not on file    Vital Signs:  BP (!) 85/60   Pulse 77   Ht 5' (1.524 m)   Wt 199 lb (90.3 kg)   SpO2 96%   BMI 38.86 kg/m     Neurological Exam: MENTAL STATUS including orientation to time, place, person, recent and remote memory, attention span and concentration, language, and fund of knowledge is normal.  Speech is not dysarthric.  CRANIAL NERVES: II:  No visual field defects.     III-IV-VI: Pupils equal round and reactive to light.  Normal conjugate, extra-ocular eye movements in all directions of gaze.  No nystagmus.  No ptosis.   V:  Normal facial sensation.    VII:  Normal facial symmetry and movements.   VIII:  Normal hearing and vestibular function.   IX-X:  Normal palatal movement.   XI:  Normal shoulder shrug and head rotation.   XII:  Normal tongue strength and range of motion, no deviation or fasciculation.  MOTOR:  Mild atrophy of the intrinsic hand muscles.  No fasciculations or abnormal movements.  No pronator drift.   Upper Extremity:  Right  Left  Deltoid  4/5   4/5   Biceps  4/5   4/5   Triceps  4/5   4/5   Wrist extensors  4/5   4/5   Wrist flexors  4/5   4/5   Finger extensors  3+/5   3+/5   Finger flexors  3/5   3/5   Dorsal interossei  4-/5   4-/5   Abductor pollicis  4-/5   4-/5   Tone (Ashworth scale)  0  0   Lower Extremity:  Right  Left  Hip flexors  5-/5   5/-5   Knee flexors  5/5   5/5   Knee extensors  5/5   5/5   Dorsiflexors  5/5   5/5   Plantarflexors  5/5   5/5   Toe extensors  5/5   5/5   Toe flexors  5/5   5/5   Tone (Ashworth scale)  0  0   MSRs:                                           Right        Left brachioradialis 3+  3+  biceps 3+  3+  triceps 3+  3+  patellar 3+  3+  ankle jerk 3+  3+  plantar response up  up  Sustained ankle clonus bilaterally  SENSORY:  Absent vibration and temperature in the arms and legs (diffusely),  vibration is present in the great toe bilaterally  Pin prick is intact throughout.   COORDINATION/GAIT: Normal finger-to- nose-finger.  Finger tapping is slowed due to hand weakness. Gait not tested, patient arrived in transport chair  Total time spent reviewing records, interview, history/exam, documentation, and coordination of care on day of encounter:  60 min    Thank you for allowing me to participate in patient's care.  If I can answer any additional questions, I would be pleased to do so.    Sincerely,    Andrianna Manalang K. Allena Katz, DO

## 2023-04-29 ENCOUNTER — Encounter: Payer: Self-pay | Admitting: Neurology

## 2023-04-29 IMAGING — DX DG KNEE COMPLETE 4+V*L*
4 series · 4 of 4 positions shown · non-contrast
Comparison: Contralateral knee of the same date.

CLINICAL DATA: Fall on marble floor yesterday, bilateral knee pain,
history of bilateral knee replacement.

EXAM:
LEFT KNEE - COMPLETE 4+ VIEW

[knee ap]
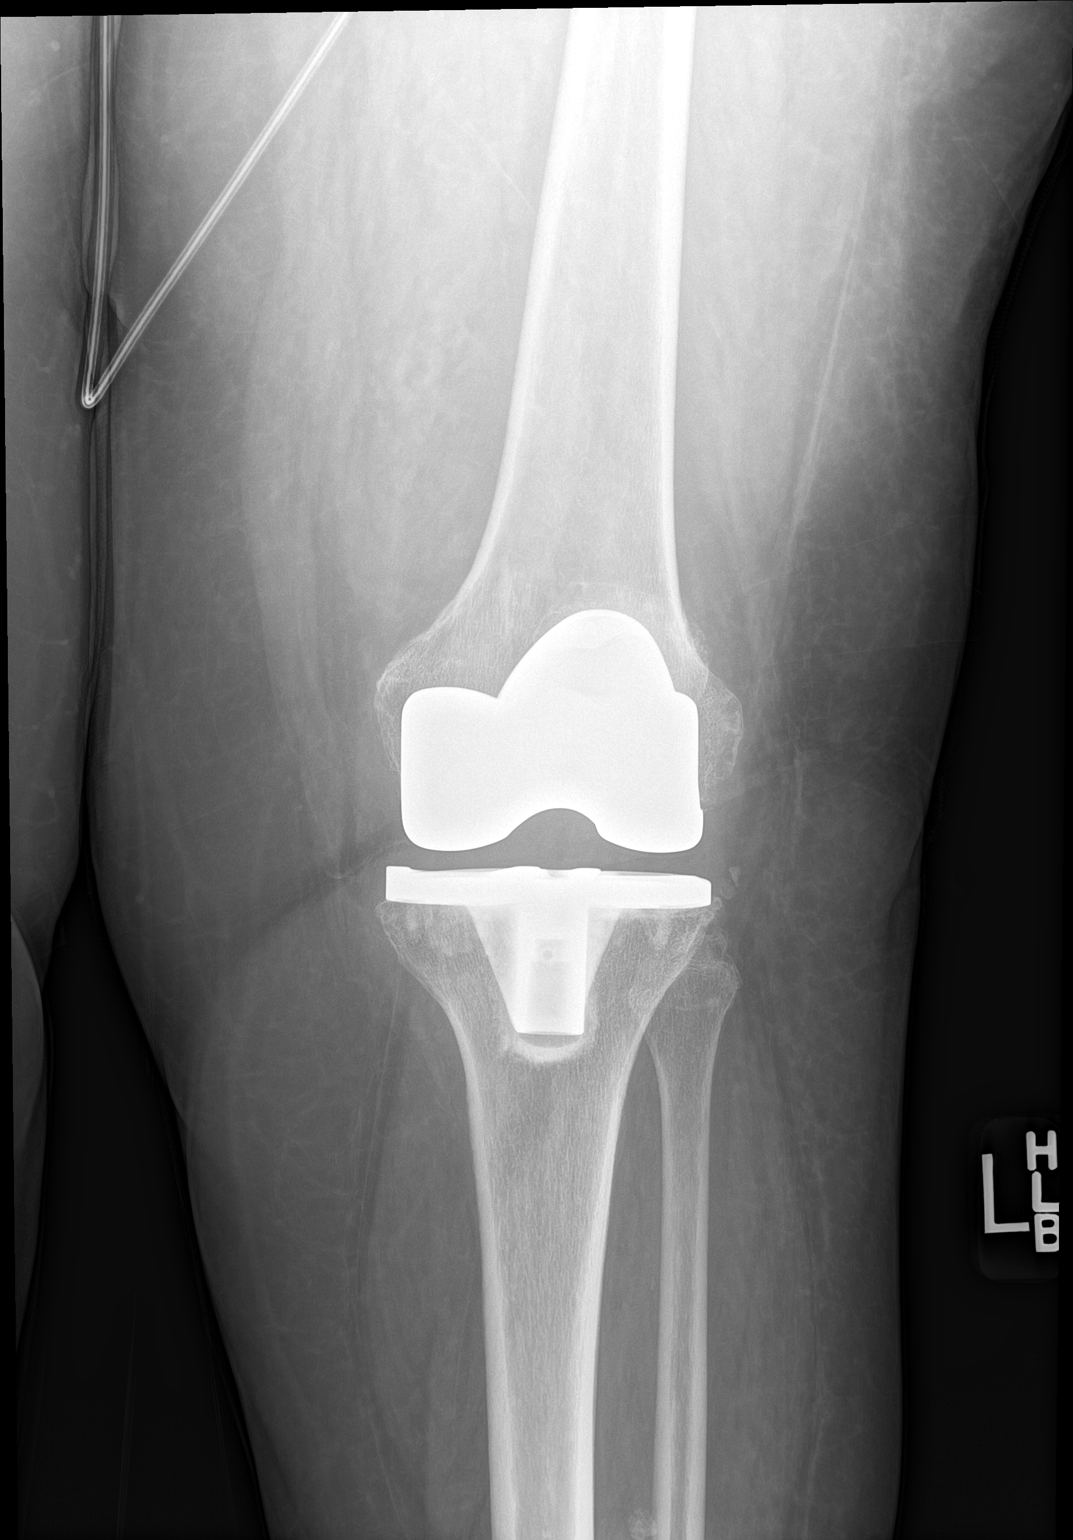

[knee obl (1 of 2)]
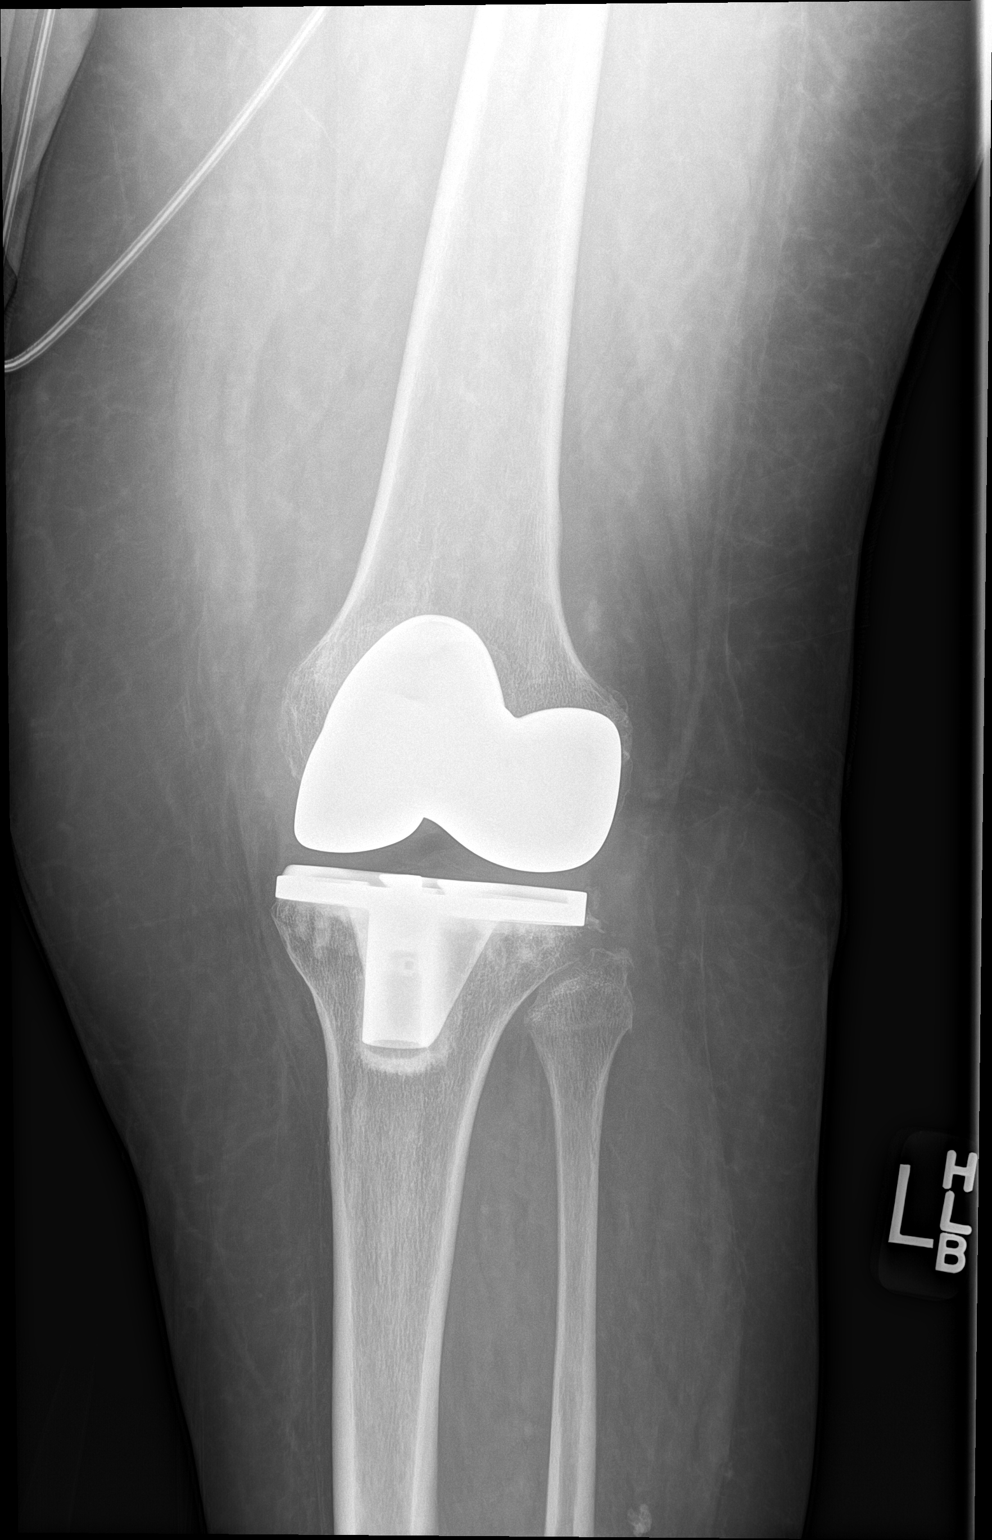

[knee obl (2 of 2)]
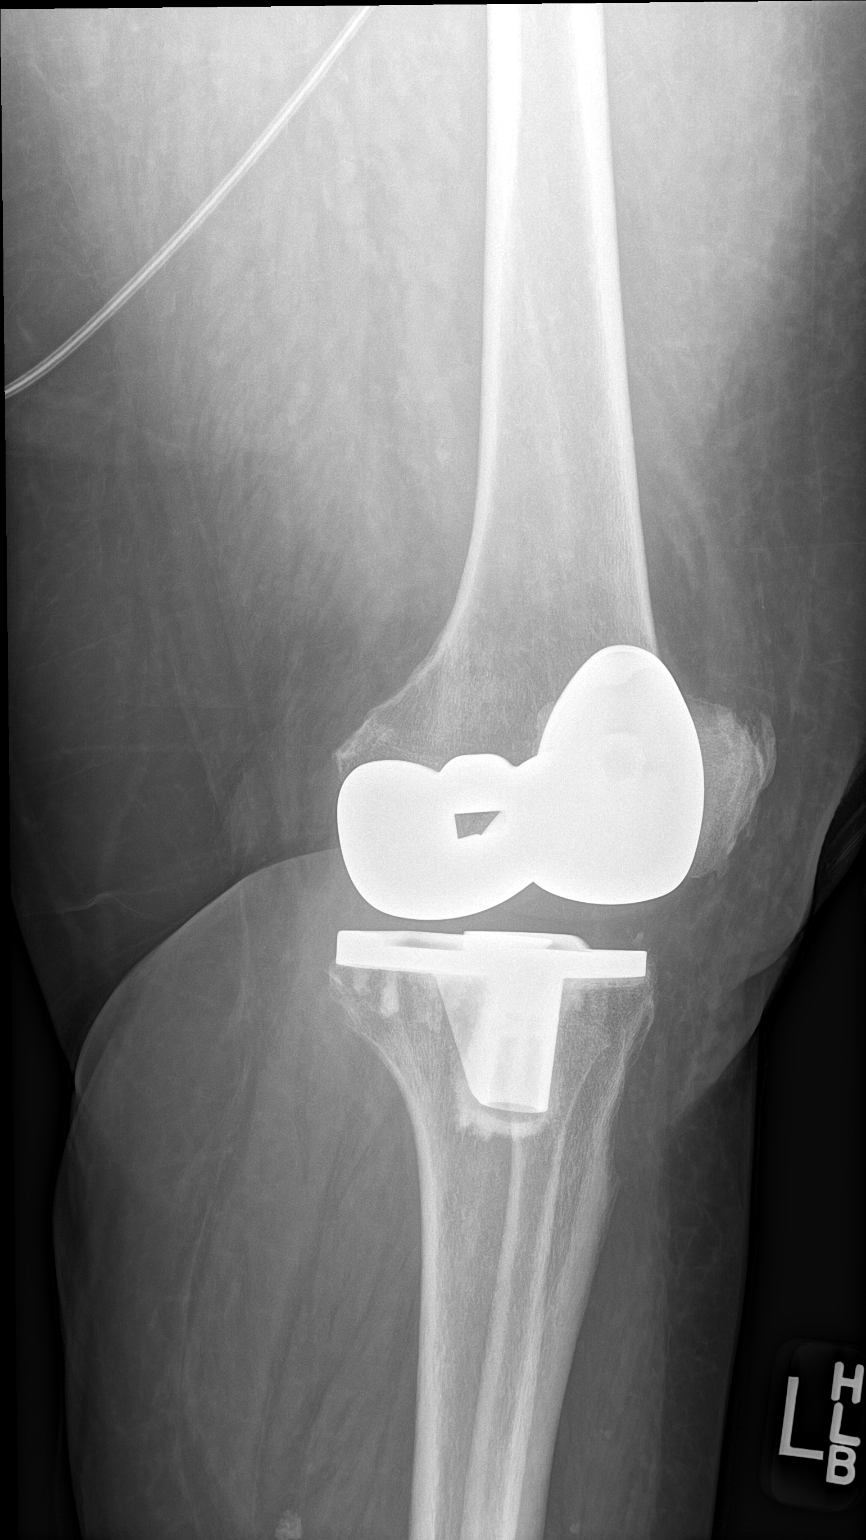

[knee lat]
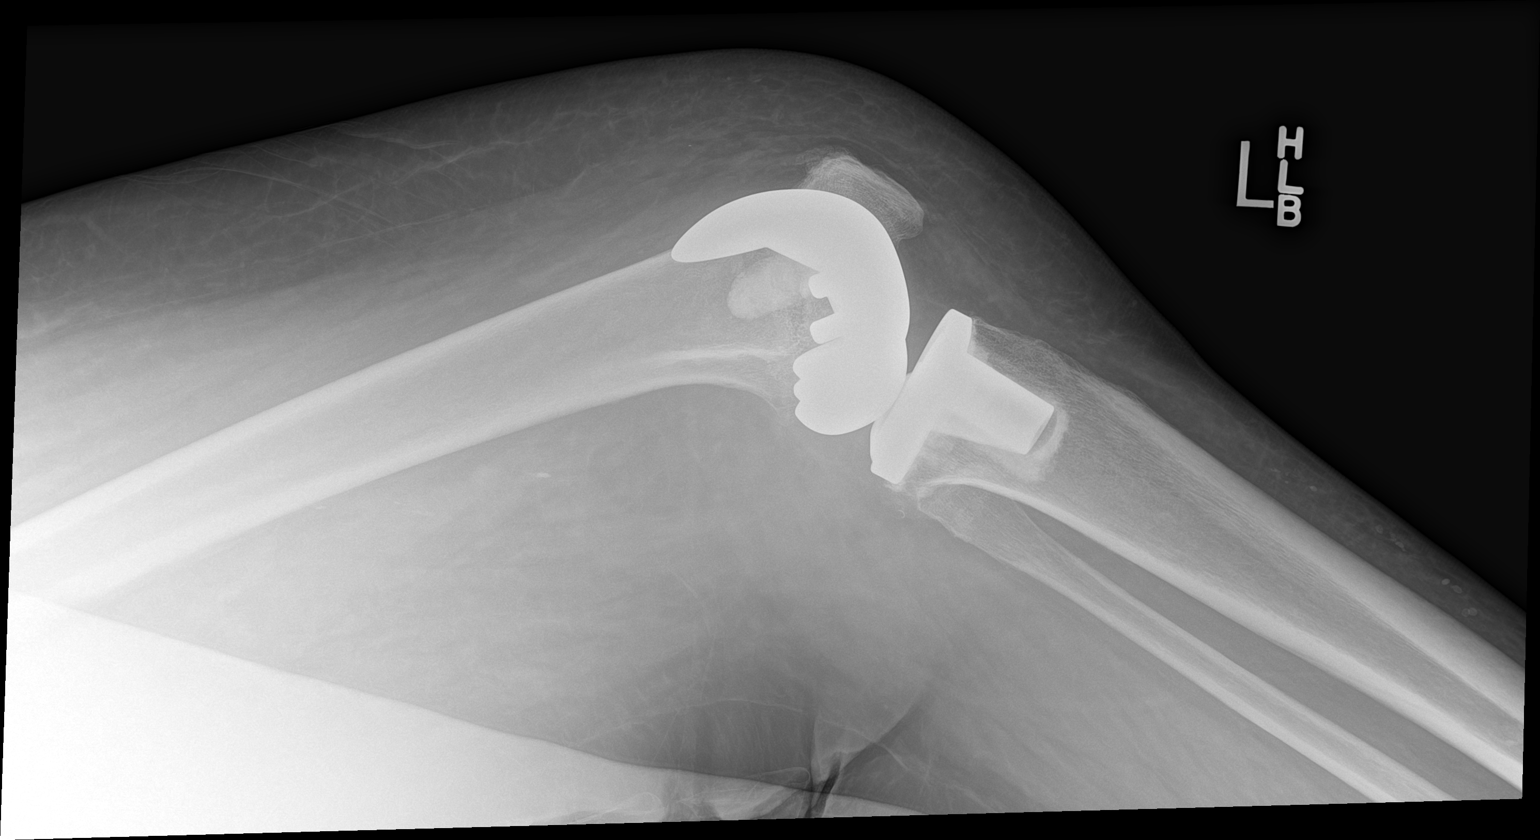

[4 of 4 positions shown; findings below may reference images not displayed]

FINDINGS: Post LEFT total knee arthroplasty.  Enthesopathy upon the patella.

Osteopenia. No acute fracture or sign of dislocation. No joint
effusion.
IMPRESSION: Post LEFT total knee arthroplasty. No acute fracture or dislocation.

## 2023-05-01 ENCOUNTER — Ambulatory Visit: Payer: Medicare HMO | Admitting: Cardiology

## 2023-05-01 ENCOUNTER — Encounter: Payer: Self-pay | Admitting: Cardiology

## 2023-05-01 VITALS — BP 109/65 | HR 80 | Ht 61.5 in | Wt 204.0 lb

## 2023-05-01 DIAGNOSIS — I5022 Chronic systolic (congestive) heart failure: Secondary | ICD-10-CM

## 2023-05-01 DIAGNOSIS — E854 Organ-limited amyloidosis: Secondary | ICD-10-CM

## 2023-05-01 DIAGNOSIS — I951 Orthostatic hypotension: Secondary | ICD-10-CM

## 2023-05-01 DIAGNOSIS — Z95810 Presence of automatic (implantable) cardiac defibrillator: Secondary | ICD-10-CM

## 2023-05-01 NOTE — Progress Notes (Signed)
Primary Physician/Referring:  Cristino Martes, NP  Patient ID: Taylor Jordan, female    DOB: 09-19-1946, 77 y.o.   MRN: [id number]  Chief Complaint  Patient presents with   Cardiomyopathy   Follow-up   HPI:    Taylor Jordan  is a 77 y.o.  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  Previously reduced as low as 25-30% (01/05/22), subsequently improved to 40-45% (06/13/22) with GDMT. A Lexiscan from 2022 was negative for ischemia. 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  She is pretty much wheelchair-bound and hardly walks with the help of walker at home due to neurologic weakness.  Denies dyspnea, PND or orthopnea, leg edema.  This is a 6-week office visit, no change in symptoms.  She underwent echocardiogram.  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 No pertinent surgical history. Family History  Problem Relation Age of Onset   Heart disease Mother     Social History   Tobacco Use   Smoking status: Former    Packs/day: 1.00    Years: 30.00    Additional pack years: 0.00    Total pack years: 30.00    Types: Cigarettes    Quit date: 1986    Years since  quitting: 38.5    Passive exposure: Never   Smokeless tobacco: Never  Substance Use Topics   Alcohol use: Never   Marital Status: Divorced  ROS  Review of Systems  Cardiovascular:  Negative for chest pain, dyspnea on exertion and leg swelling.  Musculoskeletal:  Positive for muscle weakness. Muscle cramps: .diag.  Objective      05/01/2023   10:46 AM 04/25/2023   12:36 PM 03/20/2023    1:37 PM  Vitals with BMI  Height 5' 1.5" 5\' 0"  5\' 0"   Weight 204 lbs 199 lbs 204 lbs  BMI 37.93 38.86 39.84  Systolic 109 85 80  Diastolic 65 60 70  Pulse 80 77 71   SpO2: 96 %  Orthostatic VS for the past 72 hrs (Last 3 readings):  Patient Position BP Location Cuff Size  05/01/23 1046 Sitting Left Arm Normal    Physical Exam Constitutional:      Comments: Morbidly obese in no acute distress.  Neck:     Vascular: No carotid bruit or JVD.  Cardiovascular:     Rate and Rhythm: Normal rate and regular rhythm.     Pulses: Normal pulses and intact distal pulses.     Heart sounds: Normal heart sounds. No murmur heard.    No gallop.  Pulmonary:     Effort: Pulmonary effort is normal.     Breath sounds: Normal breath sounds.  Abdominal:     General: Bowel sounds are normal.     Palpations: Abdomen is soft.  Comments: Obese. Pannus present  Musculoskeletal:     Right lower leg: No edema.     Left lower leg: No edema.    Laboratory examination:   Recent Labs    08/24/22 1129 10/11/22 2209  NA 140 137  K 3.4* 3.9  CL 101 103  CO2 28 25  GLUCOSE 106* 89  BUN 5* 14  CREATININE 0.80 0.82  CALCIUM 9.6 9.7  GFRNONAA >60 >60    Lab Results  Component Value Date   GLUCOSE 89 10/11/2022   NA 137 10/11/2022   K 3.9 10/11/2022   CL 103 10/11/2022   CO2 25 10/11/2022   BUN 14 10/11/2022   CREATININE 0.82 10/11/2022   GFRNONAA >60 10/11/2022   CALCIUM 9.7 10/11/2022   PROT 7.2 08/24/2022   ALBUMIN 4.1 08/24/2022   BILITOT 0.5 08/24/2022   ALKPHOS 38 08/24/2022   AST 16  08/24/2022   ALT 10 08/24/2022   ANIONGAP 9 10/11/2022      Lab Results  Component Value Date   ALT 10 08/24/2022   AST 16 08/24/2022   ALKPHOS 38 08/24/2022   BILITOT 0.5 08/24/2022       Latest Ref Rng & Units 08/24/2022   11:29 AM 12/01/2021   12:40 PM  Hepatic Function  Total Protein 6.5 - 8.1 g/dL 7.2  7.0   Albumin 3.5 - 5.0 g/dL 4.1  4.0   AST 15 - 41 U/L 16  20   ALT 0 - 44 U/L 10  15   Alk Phosphatase 38 - 126 U/L 38  40   Total Bilirubin 0.3 - 1.2 mg/dL 0.5  0.5     External labs:   Free Kappa And Lambda Light Chains with Ratio, Quantitative 11/29/2022 Component Ref Range & Units 3 mo ago  HX FREE KAPPA 3.30 - 19.40 mg/L 33.51 High   HX FREE LAMBDA 5.71 - 26.30 mg/L 19.55  HX KAPPA/LAMBDA RATIO 0.26 - 1.65 1.71 High   Resulting Agency [facility]  Narrative Performed by [facility]  HX B-TYPE NATRIURETIC PEPTIDE 5 - 100 PG/ML 12/27/2022 110 High    Lipid profile 02/27/2023:  Total cholesterol 146, triglycerides 77, HDL 41, LDL 88.  HX TSH  0.45 - 5.33 UIU/ML 26 2023 1.30   Radiology:   Chest x-ray 12/12/2022: 1.  Mild bibasilar atelectasis/scarring without evidence of acute cardiac or pulmonary abnormality.  2.  Left subclavian cardiac rhythm maintenance device in situ. No evidence of lead fracture or discontinuity to preclude MRI imaging.   Cardiac Studies:   Lower Extremity Venous Duplex  02/08/2023: Right  No evidence of deep vein thrombosis or venous obstruction in the lower extremity. Valvular incompetence of the common femoral vein, saphenofemoral junction, and mid small saphenous vein only. Shin edema noted. Spot imaging of the posterior tibial artery revealed multiphasic flow.  Left  No evidence of deep vein thrombosis or venous obstruction in the lower extremity. Valvular incompetence of the common femoral vein only. Calf and ankle edema noted. Spot imaging of the posterior tibial artery revealed multiphasic flow. No  results found for this or any previous visit from the past 1095 days.  Nuclear medicine cardial pyrophosphate study 10/17/2022: Visual and quantitative assessment (grade 2, H/CLL equal 1.9) are  strongly suggestive of transthyretin amyloidosis.     Echocardiogram 04/17/2023: 1. Relative apical sparing noted in the strain suggests amyloid heart disease. Left ventricular ejection fraction, by estimation, is 35 to 40%. Left ventricular ejection fraction by 3D  volume is 39 %. The left ventricle has moderately decreased  function. The left ventricle demonstrates global hypokinesis. Left ventricular diastolic parameters were normal. The average left ventricular global longitudinal strain is -6.6 %. The global longitudinal strain is abnormal.  2. Pacemaker/ICD lead noted. Right ventricular systolic function is normal. The right ventricular size is normal. There is normal pulmonary artery systolic pressure. The estimated right ventricular systolic pressure is 25.8 mmHg.  3. Left atrial size was moderately dilated.  4. The mitral valve is normal in structure. No evidence of mitral valve regurgitation. No evidence of mitral stenosis. Moderate mitral annular calcification.  5. The aortic valve is tricuspid. There is mild calcification of the aortic valve. There is moderate thickening of the aortic valve. Aortic valve regurgitation is trivial. Aortic valve sclerosis/calcification is present, without any evidence of aortic  stenosis.   ICD remote dual-chamber Medtronic EVERA MRI 05/26/2018    Remote dual-chamber ICD transmission 03/01/2023: Longevity 5 years and 11 months.  AP 4.6%, VP <0.1%.  Lead impedance and thresholds are normal.  There were no high rate episodes.  Thoracic impedance is chronically just below baseline and stable.  EKG:   EKG 03/20/2023: Normal sinus rhythm at the rate of 78 bpm, left atrial enlargement, normal axis.  Low-voltage complexes.   Medications and allergies  No Known  Allergies   Medication list   Current Outpatient Medications:    ALPHA LIPOIC AC-BIOTIN-BERBERI PO, Take 1 tablet by mouth daily., Disp: , Rfl:    ASPIRIN LOW DOSE 81 MG tablet, Take 81 mg by mouth daily., Disp: , Rfl:    B Complex-Folic Acid (SUPER B COMPLEX MAXI PO), Take by mouth. PRN, Disp: , Rfl:    Baclofen 5 MG TABS, Take 3 tablets by mouth 3 (three) times daily., Disp: , Rfl:    carvedilol (COREG) 6.25 MG tablet, Take 6.25 mg by mouth 2 (two) times daily. Take 3 tabs BID, Disp: , Rfl:    cholecalciferol (VITAMIN D3) 25 MCG (1000 UNIT) tablet, Take 1,000 Units by mouth daily., Disp: , Rfl:    CHOLINE PO, Take 1 tablet by mouth daily at 6 (six) AM., Disp: , Rfl:    Coenzyme Q10 (CO Q 10 PO), Take by mouth., Disp: , Rfl:    fexofenadine (ALLEGRA) 180 MG tablet, Take 180 mg by mouth daily., Disp: , Rfl:    furosemide (LASIX) 20 MG tablet, Take 20 mg by mouth every other day., Disp: , Rfl:    gabapentin (NEURONTIN) 300 MG capsule, Take 300 mg by mouth 2 (two) times daily., Disp: , Rfl:    ibuprofen (ADVIL) 200 MG tablet, Take 200 mg by mouth every 6 (six) hours as needed. 4 tabs in the morning and 2 tab in the evening, Disp: , Rfl:    magnesium oxide (MAG-OX) 400 MG tablet, Take 400 mg by mouth 2 (two) times daily., Disp: , Rfl:    Multiple Vitamin (MULTI-VITAMIN) tablet, Take 1 tablet by mouth daily., Disp: , Rfl:    Omega-3 Fatty Acids (SUPER OMEGA 3 PO), Take by mouth., Disp: , Rfl:    PARoxetine (PAXIL) 40 MG tablet, Take 40 mg by mouth in the morning., Disp: , Rfl:    sacubitril-valsartan (ENTRESTO) 49-51 MG, Take 0.5 tablets by mouth 2 (two) times daily., Disp: , Rfl:    spironolactone (ALDACTONE) 25 MG tablet, Take 12.5 mg by mouth daily., Disp: , Rfl:    Turmeric (QC TUMERIC COMPLEX PO), Take by mouth., Disp: , Rfl:  VYNDAMAX 61 MG CAPS, Take 1 capsule by mouth daily., Disp: , Rfl:   Assessment     ICD-10-CM   1. Familial transthyretin amyloid cardiomyopathy (HCC)  E85.4     I43     2. Chronic HFrEF (heart failure with reduced ejection fraction) (HCC)  I50.22     3. Orthostatic hypotension  I95.1     4. ICD Dual chamber Medtronic ICD-EVERA XT/DR-placed 05/26/2018  Z95.810        No orders of the defined types were placed in this encounter.   No orders of the defined types were placed in this encounter.  Medication changes today: Reduce Aldactone from 25 mg to 1/2 tablet daily Reduce Entresto from 49/51 mg to 1/2 tablet twice daily  There are no discontinued medications.    Recommendations:   Taylor Jordan is a 77 y.o.  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  Previously reduced as low as 25-30% (01/05/22), subsequently improved to 40-45% (06/13/22) with GDMT. A Lexiscan from 2022 was negative for ischemia. 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  1. Familial transthyretin amyloid cardiomyopathy (HCC) Patient is presently being appropriately medically treated both with Entresto, spironolactone and also she is presently on Vyndamax.  I reviewed her echocardiogram.  2. Chronic HFrEF (heart failure with reduced ejection fraction) (HCC) Patient's LVEF has remained stable around 40%.  She has been scheduled for cervical decompression surgery on 06/01/2023, surgical letter has been sent for clearance.  There is no clinical evidence of heart failure, ICD data also does not suggest heart failure symptoms.  With regard to lower extremity edema, she has been recommended compression pump, I do not see a contraindication for the same.  3. Orthostatic hypotension Patient's orthostasis would certainly be helped by using support  stockings and pump, continue present medical therapy.  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 Nita Sickle).  On her last office visit also reduce the dose of the Entresto and spironolactone due to very soft blood pressure.  She has not had any side effects from this change, no clinical evidence of heart failure, lungs are clear.  I will see him back in 6 months or sooner if problems.  With regard to the ICD, I will continue to monitor her CPM on them every 31-day basis to closely follow heart failure symptoms or signs.  - ECHOCARDIOGRAM COMPLETE; Future    Yates Decamp, MD, [facility] 05/01/2023, 11:11 AM Office: [phone]

## 2023-05-02 ENCOUNTER — Telehealth: Payer: Self-pay | Admitting: Pharmacy Technician

## 2023-05-02 ENCOUNTER — Other Ambulatory Visit (HOSPITAL_COMMUNITY): Payer: Self-pay

## 2023-05-02 NOTE — Telephone Encounter (Addendum)
Received PA request from [facility] that MD is prescribing [facility] for patient. Will require PA.Enrollment form has been sent to [facility]  Submitted a Prior Authorization request to [facility] for  [place] 45mg /0.76ml  via CoverMyMeds. Will update once we receive a response.  Dosing- 1 injection per month  Key: [id number]

## 2023-05-02 NOTE — Telephone Encounter (Signed)
Patient Advocate Encounter  Prior Authorization for Taylor Jordan has been approved with [facility] [employer].    PA# (Key: [id number]) - [id number]  Patient's copay is zero for 1 month supply.  Effective dates: 05/02/23 through 11/07/23

## 2023-05-02 NOTE — Telephone Encounter (Signed)
This has been approved. Appears [facility] has access to this medication. Zero copay through patient's insurance. Thanks!

## 2023-05-03 ENCOUNTER — Encounter: Payer: Self-pay | Admitting: Neurology

## 2023-05-04 ENCOUNTER — Other Ambulatory Visit (HOSPITAL_COMMUNITY): Payer: Self-pay

## 2023-05-04 MED ORDER — WAINUA 45 MG/0.8ML ~~LOC~~ SOAJ
45.0000 mg | SUBCUTANEOUS | 11 refills | Status: DC
  Filled 2023-05-04: qty 0.8, 28d supply, fill #0

## 2023-05-04 NOTE — Telephone Encounter (Signed)
Mychart message sent to patient.

## 2023-05-04 NOTE — Telephone Encounter (Signed)
Rx sent to [facility]. 

## 2023-05-04 NOTE — Addendum Note (Signed)
Addended by: Glendale Chard on: 05/04/2023 02:24 PM   Modules accepted: Orders

## 2023-05-05 ENCOUNTER — Other Ambulatory Visit (HOSPITAL_COMMUNITY): Payer: Self-pay

## 2023-05-05 ENCOUNTER — Other Ambulatory Visit: Payer: Self-pay

## 2023-05-05 MED ORDER — WAINUA 45 MG/0.8ML ~~LOC~~ SOAJ
45.0000 mg | SUBCUTANEOUS | 11 refills | Status: AC
  Filled 2023-05-05 (×3): qty 0.8, 28d supply, fill #0
  Filled 2023-05-30: qty 0.8, 28d supply, fill #1

## 2023-05-08 ENCOUNTER — Other Ambulatory Visit: Payer: Self-pay

## 2023-05-09 ENCOUNTER — Encounter: Payer: Self-pay | Admitting: Neurology

## 2023-05-12 ENCOUNTER — Encounter: Payer: Self-pay | Admitting: Cardiology

## 2023-05-12 NOTE — Telephone Encounter (Signed)
From pt

## 2023-05-15 ENCOUNTER — Other Ambulatory Visit: Payer: Self-pay | Admitting: Nurse Practitioner

## 2023-05-15 ENCOUNTER — Ambulatory Visit
Admission: RE | Admit: 2023-05-15 | Discharge: 2023-05-15 | Disposition: A | Discharge status: Home / Self Care | Payer: Medicare HMO | Source: Ambulatory Visit | Attending: Nurse Practitioner | Admitting: Nurse Practitioner

## 2023-05-15 DIAGNOSIS — Z9181 History of falling: Secondary | ICD-10-CM

## 2023-05-22 ENCOUNTER — Emergency Department (HOSPITAL_BASED_OUTPATIENT_CLINIC_OR_DEPARTMENT_OTHER)
Admission: EM | Admit: 2023-05-22 | Discharge: 2023-05-22 | Disposition: A | Discharge status: Home / Self Care | Payer: Medicare HMO | Source: Home / Self Care | Attending: Emergency Medicine | Admitting: Emergency Medicine

## 2023-05-22 ENCOUNTER — Other Ambulatory Visit (HOSPITAL_BASED_OUTPATIENT_CLINIC_OR_DEPARTMENT_OTHER): Payer: Self-pay

## 2023-05-22 ENCOUNTER — Encounter (HOSPITAL_BASED_OUTPATIENT_CLINIC_OR_DEPARTMENT_OTHER): Payer: Self-pay

## 2023-05-22 ENCOUNTER — Other Ambulatory Visit: Payer: Self-pay

## 2023-05-22 DIAGNOSIS — B353 Tinea pedis: Secondary | ICD-10-CM | POA: Insufficient documentation

## 2023-05-22 DIAGNOSIS — Z950 Presence of cardiac pacemaker: Secondary | ICD-10-CM | POA: Insufficient documentation

## 2023-05-22 DIAGNOSIS — Z7982 Long term (current) use of aspirin: Secondary | ICD-10-CM | POA: Insufficient documentation

## 2023-05-22 DIAGNOSIS — I110 Hypertensive heart disease with heart failure: Secondary | ICD-10-CM | POA: Diagnosis not present

## 2023-05-22 DIAGNOSIS — I509 Heart failure, unspecified: Secondary | ICD-10-CM | POA: Insufficient documentation

## 2023-05-22 DIAGNOSIS — L819 Disorder of pigmentation, unspecified: Secondary | ICD-10-CM | POA: Diagnosis present

## 2023-05-22 LAB — BASIC METABOLIC PANEL
Anion gap: 9 (ref 5–15)
BUN: 11 mg/dL (ref 8–23)
CO2: 27 mmol/L (ref 22–32)
Calcium: 9.9 mg/dL (ref 8.9–10.3)
Chloride: 102 mmol/L (ref 98–111)
Creatinine, Ser: 0.65 mg/dL (ref 0.44–1.00)
GFR, Estimated: >60 mL/min (ref >60)
Glucose, Bld: 94 mg/dL (ref 70–99)
Potassium: 4.1 mmol/L (ref 3.5–5.1)
Sodium: 138 mmol/L (ref 135–145)

## 2023-05-22 LAB — CBC
HCT: 37.3 % (ref 36.0–46.0)
Hemoglobin: 12 g/dL (ref 12.0–15.0)
MCH: 29.3 pg (ref 26.0–34.0)
MCHC: 32.2 g/dL (ref 30.0–36.0)
MCV: 91 fL (ref 80.0–100.0)
Platelets: 234 10*3/uL (ref 150–400)
RBC: 4.1 MIL/uL (ref 3.87–5.11)
RDW: 13.9 % (ref 11.5–15.5)
WBC: 5.5 10*3/uL (ref 4.0–10.5)
nRBC: 0 % (ref 0.0–0.2)

## 2023-05-22 MED ORDER — TERBINAFINE HCL 1 % EX CREA
1.0000 | TOPICAL_CREAM | Freq: Two times a day (BID) | CUTANEOUS | 0 refills | Status: AC
  Filled 2023-05-22: qty 30, 15d supply, fill #0

## 2023-05-22 NOTE — ED Notes (Signed)
 Reviewed AVS/discharge instruction with patient. Time allotted for and all questions answered. Patient is agreeable for d/c and escorted to ed exit by staff.  

## 2023-05-22 NOTE — ED Provider Notes (Signed)
[place] EMERGENCY DEPARTMENT AT [facility] Provider Note   CSN: [id number] Arrival date & time: 05/22/23  1254     History  Chief Complaint  Patient presents with   Toe Discoloration    Taylor Jordan is a 77 y.o. female.  Patient is a 77 year old female with a history of hypertension, CHF status post pacemaker who is presenting today because she noticed her toes looking dark bilaterally.  She states she is its only been there for about 1 week.  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  She has not noticed any redness or darkening of the bottom of her feet and reports her toes are always cold.  She has been wearing her compression socks but is not always consistent.  She denies any new medications or any other issues.  She reports that she just wanted to make sure everything was okay because she is getting back surgery in 10 days.  The history is provided by the patient and medical records.       Home Medications Prior to Admission medications   Medication Sig Start Date End Date Taking? Authorizing Provider  terbinafine (LAMISIL) 1 % cream Apply 1 Application topically 2 (two) times daily. 05/22/23  Yes Dorrine Montone, Alphonzo Lemmings, MD  ALPHA LIPOIC AC-BIOTIN-BERBERI PO Take 1 tablet by mouth daily.    [provider]  ASPIRIN LOW DOSE 81 MG tablet Take 81 mg by mouth daily.    [provider]  B Complex-Folic Acid (SUPER B COMPLEX MAXI PO) Take by mouth. PRN    [provider]  Baclofen 5 MG TABS Take 3 tablets by mouth 3 (three) times daily.    [provider]  carvedilol (COREG) 6.25 MG tablet Take 6.25 mg by mouth 2 (two) times daily. Take 3 tabs BID 09/22/21   [provider]  cholecalciferol (VITAMIN D3) 25 MCG (1000 UNIT) tablet Take 1,000 Units by mouth daily. 02/01/21   [provider]  CHOLINE PO Take 1  tablet by mouth daily at 6 (six) AM.    [provider]  Coenzyme Q10 (CO Q 10 PO) Take by mouth.    [provider]  Eplontersen Sodium (WAINUA) 45 MG/0.8ML SOAJ Inject 45 mg into the skin every 28 days. 05/05/23   Patel, Roxana Hires K, DO  fexofenadine (ALLEGRA) 180 MG tablet Take 180 mg by mouth daily. 11/29/22   [provider]  furosemide (LASIX) 20 MG tablet Take 20 mg by mouth every other day. 09/28/22   [provider]  gabapentin (NEURONTIN) 300 MG capsule Take 300 mg by mouth 2 (two) times daily. 01/12/23   [provider]  ibuprofen (ADVIL) 200 MG tablet Take 200 mg by mouth every 6 (six) hours as needed. 4 tabs in the morning and 2 tab in the evening    [provider]  magnesium oxide (MAG-OX) 400 MG tablet Take 400 mg by mouth 2 (two) times daily. 11/01/22   [provider]  Multiple Vitamin (MULTI-VITAMIN) tablet Take 1 tablet by mouth daily.    [provider]  Omega-3 Fatty Acids (SUPER OMEGA 3 PO) Take by mouth.    [provider]  PARoxetine (PAXIL) 40 MG tablet Take 40 mg by mouth in the morning.    [provider]  sacubitril-valsartan (ENTRESTO) 49-51 MG Take 0.5 tablets by mouth 2 (two) times daily. 09/17/21  [provider]  spironolactone (ALDACTONE) 25 MG tablet Take 12.5 mg by mouth daily. 11/12/21   [provider]  Turmeric (QC TUMERIC COMPLEX PO) Take by mouth.    [provider]  VYNDAMAX 61 MG CAPS Take 1 capsule by mouth daily.    [provider]      Allergies    Patient has no known allergies.    Review of Systems   Review of Systems  Physical Exam Updated Vital Signs BP 131/84   Pulse (!) 108   Temp 98.1 F (36.7 C) (Oral)   Resp 18   Ht 5' (1.524 m)   Wt 90.7 kg   SpO2 96%   BMI 39.06 kg/m  Physical Exam Vitals and nursing note reviewed.  HENT:     Head: Normocephalic.  Cardiovascular:     Pulses: Normal pulses.  Pulmonary:      Effort: Pulmonary effort is normal. No respiratory distress.  Musculoskeletal:     Right lower leg: Edema present.     Left lower leg: Edema present.     Comments: 1-2+ pitting edema bilateral lower extremities to the mid shin  Skin:    Comments: Darkening of the skin over the dorsal toes on bilateral feet with some dryness and flaking.  Also some exudate in the webspaces of the toes.  The plantar surface of both feet are normal.  Feet are cool to the touch but less than 3-second capillary refill in all toes.  2+ DP pulses bilaterally.  Neurological:     Mental Status: She is alert. Mental status is at baseline.     Comments: Decreased sensation over the toes  Psychiatric:        Mood and Affect: Mood normal.        Behavior: Behavior normal.     ED Results / Procedures / Treatments   Labs (all labs ordered are listed, but only abnormal results are displayed) Labs Reviewed  BASIC METABOLIC PANEL  CBC    EKG None  Radiology No results found.  Procedures Procedures    Medications Ordered in ED Medications - No data to display  ED Course/ Medical Decision Making/ A&P                             Medical Decision Making Amount and/or Complexity of Data Reviewed Labs: ordered.  Risk OTC drugs.   Patient presenting today due to discoloration of her feet.  Patient has no evidence of arterial compromise as she has strong pulses bilaterally and normal capillary refill.  Patient does have some thickening, darkening and dryness of the skin on the dorsal portion of her feet as well as some scaling between the toes.  Concern for possible fungal infection vs statis dermatitis.  No wounds visible.  Will start with anti-fungal cream and pt encouraged to wear compression socks and exfoliate.  No signs of bacterial infection at this time.        Final Clinical Impression(s) / ED Diagnoses Final diagnoses:  Tinea pedis of both feet    Rx / DC Orders ED Discharge Orders           Ordered    terbinafine (LAMISIL) 1 % cream  2 times daily        05/22/23 1651              Gwyneth Sprout, MD 05/22/23 1702

## 2023-05-22 NOTE — ED Triage Notes (Signed)
Patient here POV from Home.  Notes Bilateral Toe Discoloration for 1 Week. Worse on Right. Chronic Numbness to Both. No Pain or Known Injury.  NAD Noted during Triage. A&Ox4. GCS 15. BIB Wheelchair.

## 2023-05-22 NOTE — Discharge Instructions (Signed)
Continue to wear your compression socks.  Exfoliate the feet at least daily and use the cream 2 times a day for the next month

## 2023-05-30 ENCOUNTER — Other Ambulatory Visit: Payer: Self-pay

## 2023-06-05 ENCOUNTER — Other Ambulatory Visit: Payer: Self-pay

## 2023-06-15 ENCOUNTER — Other Ambulatory Visit (HOSPITAL_COMMUNITY): Payer: Self-pay

## 2023-06-28 ENCOUNTER — Encounter (HOSPITAL_COMMUNITY): Payer: Self-pay

## 2023-06-28 ENCOUNTER — Other Ambulatory Visit (HOSPITAL_COMMUNITY): Payer: Self-pay

## 2023-06-30 ENCOUNTER — Other Ambulatory Visit (HOSPITAL_COMMUNITY): Payer: Self-pay

## 2023-06-30 ENCOUNTER — Encounter (HOSPITAL_COMMUNITY): Payer: Self-pay

## 2023-07-03 ENCOUNTER — Encounter (HOSPITAL_COMMUNITY): Payer: Self-pay

## 2023-07-03 ENCOUNTER — Other Ambulatory Visit (HOSPITAL_COMMUNITY): Payer: Self-pay

## 2023-07-11 ENCOUNTER — Other Ambulatory Visit (HOSPITAL_COMMUNITY): Payer: Self-pay

## 2023-08-29 ENCOUNTER — Ambulatory Visit: Payer: Medicare HMO | Admitting: Neurology

## 2023-10-25 ENCOUNTER — Ambulatory Visit: Payer: Self-pay | Admitting: Cardiology
